# Patient Record
Sex: Female | Born: 1985 | Race: Black or African American | Hispanic: No | Marital: Married | Smoking: Never smoker
Health system: Southern US, Community
[De-identification: ages and names within clinical notes are randomized; demographics above are authoritative.]

## PROBLEM LIST (undated history)

## (undated) DIAGNOSIS — F419 Anxiety disorder, unspecified: Secondary | ICD-10-CM

## (undated) DIAGNOSIS — E663 Overweight: Secondary | ICD-10-CM

## (undated) DIAGNOSIS — R87619 Unspecified abnormal cytological findings in specimens from cervix uteri: Secondary | ICD-10-CM

## (undated) DIAGNOSIS — T7840XA Allergy, unspecified, initial encounter: Secondary | ICD-10-CM

## (undated) DIAGNOSIS — K529 Noninfective gastroenteritis and colitis, unspecified: Secondary | ICD-10-CM

## (undated) DIAGNOSIS — IMO0002 Reserved for concepts with insufficient information to code with codable children: Secondary | ICD-10-CM

## (undated) DIAGNOSIS — Z8719 Personal history of other diseases of the digestive system: Secondary | ICD-10-CM

## (undated) DIAGNOSIS — G43909 Migraine, unspecified, not intractable, without status migrainosus: Secondary | ICD-10-CM

## (undated) DIAGNOSIS — J45909 Unspecified asthma, uncomplicated: Secondary | ICD-10-CM

## (undated) DIAGNOSIS — K08409 Partial loss of teeth, unspecified cause, unspecified class: Secondary | ICD-10-CM

## (undated) HISTORY — PX: ANKLE SURGERY: SHX546

## (undated) HISTORY — DX: Noninfective gastroenteritis and colitis, unspecified: K52.9

## (undated) HISTORY — DX: Personal history of other diseases of the digestive system: Z87.19

## (undated) HISTORY — DX: Migraine, unspecified, not intractable, without status migrainosus: G43.909

## (undated) HISTORY — DX: Allergy, unspecified, initial encounter: T78.40XA

## (undated) HISTORY — DX: Reserved for concepts with insufficient information to code with codable children: IMO0002

## (undated) HISTORY — PX: WISDOM TOOTH EXTRACTION: SHX21

## (undated) HISTORY — DX: Overweight: E66.3

## (undated) HISTORY — DX: Anxiety disorder, unspecified: F41.9

---

## 2009-01-12 ENCOUNTER — Inpatient Hospital Stay (HOSPITAL_COMMUNITY): Admission: AD | Admit: 2009-01-12 | Discharge: 2009-01-12 | Payer: Self-pay | Admitting: Obstetrics & Gynecology

## 2010-06-04 LAB — URINALYSIS, ROUTINE W REFLEX MICROSCOPIC
Glucose, UA: NEGATIVE mg/dL
Ketones, ur: NEGATIVE mg/dL
Protein, ur: 100 mg/dL — AB
Urobilinogen, UA: 0.2 mg/dL (ref 0.0–1.0)

## 2010-06-04 LAB — WET PREP, GENITAL
Trich, Wet Prep: NONE SEEN
Yeast Wet Prep HPF POC: NONE SEEN

## 2010-06-04 LAB — URINE MICROSCOPIC-ADD ON

## 2010-06-04 LAB — URINE CULTURE: Colony Count: >=100000

## 2010-06-04 LAB — POCT PREGNANCY, URINE: Preg Test, Ur: NEGATIVE

## 2010-06-04 LAB — GC/CHLAMYDIA PROBE AMP, GENITAL: Chlamydia, DNA Probe: NEGATIVE

## 2010-08-30 ENCOUNTER — Emergency Department (HOSPITAL_COMMUNITY)
Admission: EM | Admit: 2010-08-30 | Discharge: 2010-08-30 | Disposition: A | Discharge status: Home / Self Care | Payer: BC Managed Care – PPO | Attending: Emergency Medicine | Admitting: Emergency Medicine

## 2010-08-30 ENCOUNTER — Emergency Department (HOSPITAL_COMMUNITY): Payer: BC Managed Care – PPO

## 2010-08-30 DIAGNOSIS — R0602 Shortness of breath: Secondary | ICD-10-CM | POA: Insufficient documentation

## 2010-08-30 DIAGNOSIS — R110 Nausea: Secondary | ICD-10-CM | POA: Insufficient documentation

## 2010-08-30 DIAGNOSIS — R0609 Other forms of dyspnea: Secondary | ICD-10-CM | POA: Insufficient documentation

## 2010-08-30 DIAGNOSIS — R0989 Other specified symptoms and signs involving the circulatory and respiratory systems: Secondary | ICD-10-CM | POA: Insufficient documentation

## 2010-08-30 DIAGNOSIS — R079 Chest pain, unspecified: Secondary | ICD-10-CM | POA: Insufficient documentation

## 2010-08-30 DIAGNOSIS — R109 Unspecified abdominal pain: Secondary | ICD-10-CM | POA: Insufficient documentation

## 2010-08-30 LAB — DIFFERENTIAL
Basophils Absolute: 0 10*3/uL (ref 0.0–0.1)
Basophils Relative: 0 % (ref 0–1)
Eosinophils Absolute: 0.3 10*3/uL (ref 0.0–0.7)
Eosinophils Relative: 3 % (ref 0–5)
Neutrophils Relative %: 63 % (ref 43–77)

## 2010-08-30 LAB — URINALYSIS, ROUTINE W REFLEX MICROSCOPIC
Ketones, ur: NEGATIVE mg/dL
Leukocytes, UA: NEGATIVE
Nitrite: NEGATIVE
Protein, ur: NEGATIVE mg/dL
Urobilinogen, UA: 1 mg/dL (ref 0.0–1.0)

## 2010-08-30 LAB — BASIC METABOLIC PANEL
BUN: 7 mg/dL (ref 6–23)
GFR calc Af Amer: >60 mL/min (ref >60)
GFR calc non Af Amer: >60 mL/min (ref >60)
Potassium: 3.8 mEq/L (ref 3.5–5.1)
Sodium: 132 mEq/L — ABNORMAL LOW (ref 135–145)

## 2010-08-30 LAB — CBC
MCV: 95.8 fL (ref 78.0–100.0)
Platelets: 274 10*3/uL (ref 150–400)
RDW: 12.1 % (ref 11.5–15.5)
WBC: 9.6 10*3/uL (ref 4.0–10.5)

## 2010-08-30 LAB — D-DIMER, QUANTITATIVE: D-Dimer, Quant: <0.22 ug/mL-FEU (ref 0.00–0.48)

## 2010-08-30 LAB — POCT PREGNANCY, URINE: Preg Test, Ur: NEGATIVE

## 2010-09-04 ENCOUNTER — Other Ambulatory Visit: Payer: Self-pay | Admitting: Family Medicine

## 2010-09-04 ENCOUNTER — Ambulatory Visit
Admission: RE | Admit: 2010-09-04 | Discharge: 2010-09-04 | Disposition: A | Discharge status: Home / Self Care | Payer: BC Managed Care – PPO | Source: Ambulatory Visit | Attending: Family Medicine | Admitting: Family Medicine

## 2010-09-04 MED ORDER — IOHEXOL 300 MG/ML  SOLN
125.0000 mL | Freq: Once | INTRAMUSCULAR | Status: AC | PRN
  Administered 2010-09-04: 125 mL via INTRAVENOUS

## 2012-05-03 ENCOUNTER — Encounter (HOSPITAL_COMMUNITY): Payer: Self-pay | Admitting: *Deleted

## 2012-05-03 ENCOUNTER — Inpatient Hospital Stay (HOSPITAL_COMMUNITY)
Admission: AD | Admit: 2012-05-03 | Discharge: 2012-05-03 | Disposition: A | Discharge status: Home / Self Care | Payer: BC Managed Care – PPO | Source: Ambulatory Visit | Attending: Obstetrics and Gynecology | Admitting: Obstetrics and Gynecology

## 2012-05-03 DIAGNOSIS — Z3202 Encounter for pregnancy test, result negative: Secondary | ICD-10-CM | POA: Insufficient documentation

## 2012-05-03 DIAGNOSIS — N949 Unspecified condition associated with female genital organs and menstrual cycle: Secondary | ICD-10-CM | POA: Insufficient documentation

## 2012-05-03 DIAGNOSIS — R1031 Right lower quadrant pain: Secondary | ICD-10-CM | POA: Insufficient documentation

## 2012-05-03 HISTORY — DX: Unspecified abnormal cytological findings in specimens from cervix uteri: R87.619

## 2012-05-03 HISTORY — DX: Reserved for concepts with insufficient information to code with codable children: IMO0002

## 2012-05-03 HISTORY — DX: Unspecified asthma, uncomplicated: J45.909

## 2012-05-03 LAB — URINALYSIS, ROUTINE W REFLEX MICROSCOPIC
Bilirubin Urine: NEGATIVE
Nitrite: NEGATIVE
Specific Gravity, Urine: 1.01 (ref 1.005–1.030)
Urobilinogen, UA: 0.2 mg/dL (ref 0.0–1.0)

## 2012-05-03 NOTE — MAU Provider Note (Signed)
History   27 yo G0 MBF LMP 2/17 presents for evaluation of RLQ "right ovarian pain" which started at 7 pm. Pt denies assoc n/v/f. Had intercourse this am. Trying to conceive. Pain is nonradiating. PMP 1/24. Off OCP 12/2011  Chief Complaint  Patient presents with  . Abdominal Pain     OB History   Grav Para Term Preterm Abortions TAB SAB Ect Mult Living                  Past Medical History  Diagnosis Date  . Asthma   . Abnormal Pap smear     Past Surgical History  Procedure Laterality Date  . Ankle surgery      Right ankle    Family History  Problem Relation Age of Onset  . Cancer Maternal Aunt     Breast  . Cancer Paternal Grandmother     Breast    History  Substance Use Topics  . Smoking status: Never Smoker   . Smokeless tobacco: Not on file  . Alcohol Use: Yes     Comment: Social    Allergies:  Allergies  Allergen Reactions  . Penicillins Hives    Prescriptions prior to admission  Medication Sig Dispense Refill  . albuterol (PROVENTIL HFA;VENTOLIN HFA) 108 (90 BASE) MCG/ACT inhaler Inhale 2 puffs into the lungs as needed for wheezing.      . cetirizine (ZYRTEC ALLERGY) 10 MG tablet Take 10 mg by mouth daily.      . Prenatal Vit-Fe Fumarate-FA (PRENATAL MULTIVITAMIN) TABS Take 1 tablet by mouth daily at 12 noon.         Physical Exam   Blood pressure 131/79, pulse 95, temperature 98.9 F (37.2 C), temperature source Oral, resp. rate 20, height 5' 5.5" (1.664 m), weight 83.462 kg (184 lb), last menstrual period 04/18/2012, SpO2 100.00%.  General appearance: alert, cooperative and no distress Abdomen: soft tender RLQ w/o rebound, nondistended (+) BS  Pelvic: cervix normal in appearance, external genitalia normal, no cervical motion tenderness, uterus normal size, shape, and consistency, vagina normal without discharge and right adnexal tenderness w/o mass palp Extremities: no edema, redness or tenderness in the calves or thighs ED Course  Urine  preg test neg  Ovulatory pain P) f/u dr Juliene Pina. Declined any pain med  COUSINS,SHERONETTE A, MD 11:42 PM 05/03/2012

## 2012-05-03 NOTE — MAU Note (Signed)
Pt reports "i started to have really bad pain where my rt ovary is", states the pain started about 1 1/2 hours.  States the pain is now in her lower back and across her lower abd also.

## 2012-08-17 LAB — OB RESULTS CONSOLE ABO/RH: RH Type: NEGATIVE

## 2012-08-17 LAB — OB RESULTS CONSOLE RPR: RPR: NONREACTIVE

## 2012-08-17 LAB — OB RESULTS CONSOLE ANTIBODY SCREEN: Antibody Screen: NEGATIVE

## 2012-08-17 LAB — OB RESULTS CONSOLE HEPATITIS B SURFACE ANTIGEN: Hepatitis B Surface Ag: NEGATIVE

## 2012-08-17 LAB — OB RESULTS CONSOLE RUBELLA ANTIBODY, IGM: RUBELLA: IMMUNE

## 2012-08-17 LAB — OB RESULTS CONSOLE HIV ANTIBODY (ROUTINE TESTING): HIV: NONREACTIVE

## 2012-08-24 LAB — OB RESULTS CONSOLE GC/CHLAMYDIA
CHLAMYDIA, DNA PROBE: NEGATIVE
GC PROBE AMP, GENITAL: NEGATIVE

## 2013-02-10 LAB — OB RESULTS CONSOLE GBS: GBS: NEGATIVE

## 2013-03-07 ENCOUNTER — Other Ambulatory Visit: Payer: Self-pay | Admitting: Obstetrics & Gynecology

## 2013-03-08 ENCOUNTER — Encounter (HOSPITAL_COMMUNITY): Payer: Self-pay | Admitting: *Deleted

## 2013-03-08 ENCOUNTER — Telehealth (HOSPITAL_COMMUNITY): Payer: Self-pay | Admitting: *Deleted

## 2013-03-08 NOTE — Telephone Encounter (Signed)
Preadmission screen  

## 2013-03-09 ENCOUNTER — Inpatient Hospital Stay (HOSPITAL_COMMUNITY): Payer: BC Managed Care – PPO | Admitting: Anesthesiology

## 2013-03-09 ENCOUNTER — Encounter (HOSPITAL_COMMUNITY): Payer: Self-pay | Admitting: *Deleted

## 2013-03-09 ENCOUNTER — Inpatient Hospital Stay (HOSPITAL_COMMUNITY)
Admission: AD | Admit: 2013-03-09 | Discharge: 2013-03-12 | DRG: 765 | Disposition: A | Discharge status: Home / Self Care | Payer: BC Managed Care – PPO | Source: Ambulatory Visit | Attending: Obstetrics and Gynecology | Admitting: Obstetrics and Gynecology

## 2013-03-09 ENCOUNTER — Encounter (HOSPITAL_COMMUNITY): Payer: BC Managed Care – PPO | Admitting: Anesthesiology

## 2013-03-09 DIAGNOSIS — O9903 Anemia complicating the puerperium: Secondary | ICD-10-CM | POA: Diagnosis not present

## 2013-03-09 DIAGNOSIS — O409XX0 Polyhydramnios, unspecified trimester, not applicable or unspecified: Secondary | ICD-10-CM | POA: Diagnosis present

## 2013-03-09 DIAGNOSIS — O3660X0 Maternal care for excessive fetal growth, unspecified trimester, not applicable or unspecified: Secondary | ICD-10-CM | POA: Diagnosis present

## 2013-03-09 DIAGNOSIS — O4290 Premature rupture of membranes, unspecified as to length of time between rupture and onset of labor, unspecified weeks of gestation: Secondary | ICD-10-CM | POA: Diagnosis present

## 2013-03-09 DIAGNOSIS — O360990 Maternal care for other rhesus isoimmunization, unspecified trimester, not applicable or unspecified: Secondary | ICD-10-CM | POA: Diagnosis present

## 2013-03-09 DIAGNOSIS — D62 Acute posthemorrhagic anemia: Secondary | ICD-10-CM | POA: Diagnosis not present

## 2013-03-09 HISTORY — DX: Partial loss of teeth, unspecified cause, unspecified class: K08.409

## 2013-03-09 LAB — RPR: RPR Ser Ql: NONREACTIVE

## 2013-03-09 LAB — TYPE AND SCREEN
ABO/RH(D): A NEG
Antibody Screen: NEGATIVE

## 2013-03-09 LAB — CBC
HCT: 39 % (ref 36.0–46.0)
Hemoglobin: 13.4 g/dL (ref 12.0–15.0)
MCH: 33.3 pg (ref 26.0–34.0)
MCHC: 34.4 g/dL (ref 30.0–36.0)
MCV: 97 fL (ref 78.0–100.0)
PLATELETS: 170 10*3/uL (ref 150–400)
RBC: 4.02 MIL/uL (ref 3.87–5.11)
RDW: 14.5 % (ref 11.5–15.5)
WBC: 12.2 10*3/uL — ABNORMAL HIGH (ref 4.0–10.5)

## 2013-03-09 LAB — ABO/RH: ABO/RH(D): A NEG

## 2013-03-09 LAB — AMNISURE RUPTURE OF MEMBRANE (ROM) NOT AT ARMC: Amnisure ROM: POSITIVE

## 2013-03-09 MED ORDER — EPHEDRINE 5 MG/ML INJ
10.0000 mg | INTRAVENOUS | Status: DC | PRN
  Filled 2013-03-09: qty 4

## 2013-03-09 MED ORDER — ACETAMINOPHEN 325 MG PO TABS: 650.0000 mg | ORAL_TABLET | ORAL | Status: DC | PRN

## 2013-03-09 MED ORDER — OXYTOCIN 40 UNITS IN LACTATED RINGERS INFUSION - SIMPLE MED
1.0000 m[IU]/min | INTRAVENOUS | Status: DC
  Administered 2013-03-09: 2 m[IU]/min via INTRAVENOUS
  Filled 2013-03-09: qty 1000

## 2013-03-09 MED ORDER — TERBUTALINE SULFATE 1 MG/ML IJ SOLN: 0.2500 mg | Freq: Once | INTRAMUSCULAR | Status: AC | PRN

## 2013-03-09 MED ORDER — PHENYLEPHRINE 40 MCG/ML (10ML) SYRINGE FOR IV PUSH (FOR BLOOD PRESSURE SUPPORT)
80.0000 ug | PREFILLED_SYRINGE | INTRAVENOUS | Status: DC | PRN
  Filled 2013-03-09: qty 10

## 2013-03-09 MED ORDER — CITRIC ACID-SODIUM CITRATE 334-500 MG/5ML PO SOLN
30.0000 mL | ORAL | Status: DC | PRN
  Administered 2013-03-10: 30 mL via ORAL
  Filled 2013-03-09: qty 15

## 2013-03-09 MED ORDER — PHENYLEPHRINE 40 MCG/ML (10ML) SYRINGE FOR IV PUSH (FOR BLOOD PRESSURE SUPPORT): 80.0000 ug | PREFILLED_SYRINGE | INTRAVENOUS | Status: DC | PRN

## 2013-03-09 MED ORDER — LIDOCAINE HCL (PF) 1 % IJ SOLN: 30.0000 mL | INTRAMUSCULAR | Status: DC | PRN

## 2013-03-09 MED ORDER — FENTANYL 2.5 MCG/ML BUPIVACAINE 1/10 % EPIDURAL INFUSION (WH - ANES)
14.0000 mL/h | INTRAMUSCULAR | Status: DC | PRN
  Administered 2013-03-09: 14 mL/h via EPIDURAL
  Filled 2013-03-09: qty 125

## 2013-03-09 MED ORDER — LACTATED RINGERS IV SOLN
500.0000 mL | INTRAVENOUS | Status: DC | PRN
  Administered 2013-03-09: 500 mL via INTRAVENOUS
  Administered 2013-03-09: 200 mL via INTRAVENOUS
  Administered 2013-03-09: 500 mL via INTRAVENOUS
  Administered 2013-03-09 – 2013-03-10 (×2): 300 mL via INTRAVENOUS

## 2013-03-09 MED ORDER — FLEET ENEMA 7-19 GM/118ML RE ENEM: 1.0000 | ENEMA | RECTAL | Status: DC | PRN

## 2013-03-09 MED ORDER — BUTORPHANOL TARTRATE 1 MG/ML IJ SOLN
1.0000 mg | INTRAMUSCULAR | Status: DC | PRN
  Administered 2013-03-09: 1 mg via INTRAVENOUS
  Filled 2013-03-09: qty 1

## 2013-03-09 MED ORDER — LACTATED RINGERS IV SOLN
INTRAVENOUS | Status: DC
  Administered 2013-03-09 – 2013-03-10 (×6): via INTRAVENOUS

## 2013-03-09 MED ORDER — MISOPROSTOL 25 MCG QUARTER TABLET: 25.0000 ug | ORAL_TABLET | ORAL | Status: DC | PRN

## 2013-03-09 MED ORDER — ONDANSETRON HCL 4 MG/2ML IJ SOLN
4.0000 mg | Freq: Four times a day (QID) | INTRAMUSCULAR | Status: DC | PRN
  Administered 2013-03-10: 4 mg via INTRAVENOUS
  Filled 2013-03-09: qty 2

## 2013-03-09 MED ORDER — LACTATED RINGERS IV SOLN
500.0000 mL | Freq: Once | INTRAVENOUS | Status: AC
  Administered 2013-03-09: 500 mL via INTRAVENOUS

## 2013-03-09 MED ORDER — OXYTOCIN BOLUS FROM INFUSION: 500.0000 mL | INTRAVENOUS | Status: DC

## 2013-03-09 MED ORDER — OXYTOCIN 40 UNITS IN LACTATED RINGERS INFUSION - SIMPLE MED: 62.5000 mL/h | INTRAVENOUS | Status: DC

## 2013-03-09 MED ORDER — DIPHENHYDRAMINE HCL 50 MG/ML IJ SOLN: 12.5000 mg | INTRAMUSCULAR | Status: DC | PRN

## 2013-03-09 MED ORDER — IBUPROFEN 600 MG PO TABS: 600.0000 mg | ORAL_TABLET | Freq: Four times a day (QID) | ORAL | Status: DC | PRN

## 2013-03-09 MED ORDER — OXYCODONE-ACETAMINOPHEN 5-325 MG PO TABS: 1.0000 | ORAL_TABLET | ORAL | Status: DC | PRN

## 2013-03-09 MED ORDER — SODIUM BICARBONATE 8.4 % IV SOLN
INTRAVENOUS | Status: DC | PRN
  Administered 2013-03-09 – 2013-03-10 (×3): 5 mL via EPIDURAL
  Administered 2013-03-10: 7 mL via EPIDURAL
  Administered 2013-03-10: 5 mL via EPIDURAL

## 2013-03-09 MED ORDER — EPHEDRINE 5 MG/ML INJ: 10.0000 mg | INTRAVENOUS | Status: DC | PRN
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
## 2013-03-09 NOTE — Anesthesia Preprocedure Evaluation (Signed)
Anesthesia Evaluation  Patient identified by MRN, date of birth, ID band Patient awake    Reviewed: Allergy & Precautions, H&P , Patient's Chart, lab work & pertinent test results  Airway Mallampati: II  TM Distance: >3 FB Neck ROM: full    Dental  (+) Teeth Intact   Pulmonary asthma ,  breath sounds clear to auscultation        Cardiovascular Rhythm:regular Rate:Normal     Neuro/Psych    GI/Hepatic   Endo/Other  Morbid obesity  Renal/GU      Musculoskeletal   Abdominal   Peds  Hematology   Anesthesia Other Findings       Reproductive/Obstetrics (+) Pregnancy                             Anesthesia Physical Anesthesia Plan  ASA: III  Anesthesia Plan: Epidural   Post-op Pain Management:    Induction:   Airway Management Planned:   Additional Equipment:   Intra-op Plan:   Post-operative Plan:   Informed Consent: I have reviewed the patients History and Physical, chart, labs and discussed the procedure including the risks, benefits and alternatives for the proposed anesthesia with the patient or authorized representative who has indicated his/her understanding and acceptance.   Dental Advisory Given  Plan Discussed with:   Anesthesia Plan Comments: (Labs checked- platelets confirmed with RN in room. Fetal heart tracing, per RN, reported to be stable enough for sitting procedure. Discussed epidural, and patient consents to the procedure:  included risk of possible headache,backache, failed block, allergic reaction, and nerve injury. This patient was asked if she had any questions or concerns before the procedure started.)        Anesthesia Quick Evaluation  

## 2013-03-09 NOTE — MAU Note (Signed)
Pt presents to MAU to R/O ROM. 

## 2013-03-09 NOTE — Anesthesia Procedure Notes (Signed)
Epidural Patient location during procedure: OB  Preanesthetic Checklist Completed: patient identified, site marked, surgical consent, pre-op evaluation, timeout performed, IV checked, risks and benefits discussed and monitors and equipment checked  Epidural Patient position: sitting Prep: site prepped and draped and DuraPrep Patient monitoring: continuous pulse ox and blood pressure Approach: midline Injection technique: LOR air  Needle:  Needle type: Tuohy  Needle gauge: 17 G Needle length: 9 cm and 9 Needle insertion depth: 7 cm Catheter type: closed end flexible Catheter size: 19 Gauge Catheter at skin depth: 15 cm Test dose: negative  Assessment Events: blood not aspirated, injection not painful, no injection resistance, negative IV test and no paresthesia  Additional Notes Dosing of Epidural:  1st dose, through catheter ............................................Marland Kitchen. epi 1:200K + Xylocaine 40 mg  2nd dose, through catheter, after waiting 3 minutes...Marland Kitchen.Marland Kitchen.epi 1:200K + Xylocaine 60 mg    ( 2% Xylo charted as a single dose in Epic Meds for ease of charting; actual dosing was fractionated as above, for saftey's sake)  As each dose occurred, patient was free of IV sx; and patient exhibited no evidence of SA injection.  Patient is more comfortable after epidural dosed. Please see RN's note for documentation of vital signs,and FHR which are stable.  Patient reminded not to try to ambulate with numb legs, and that an RN must be present when she attempts to get up.

## 2013-03-09 NOTE — Progress Notes (Signed)
S: Doing well, no complaints, pain worsening, had some relief over the past 1.5 hrs with stadol, now contractions more painful. No further LOF.   O: BP 117/69  Pulse 97  Temp(Src) 98.3 F (36.8 C) (Axillary)  Resp 20  Ht 5\' 5"  (1.651 m)  Wt 104.872 kg (231 lb 3.2 oz)  BMI 38.47 kg/m2  SpO2 100%  LMP 06/06/2012   FHT:  FHR: 150s bpm, variability: moderate,  accelerations:  Present,  decelerations:  Present earlier in the evening a run of variable decels that resolved as heart rate back to baseline of 150's, Over the last 1.5 hrs, 2 prolonged decels,  2min, to the 100's w/ good return. UC:   irregular, every 2-4 minutes SVE:   Dilation: 4 Effacement (%): 100 Station:  (bulging forebag) Exam by:: Ernestina PennaFogleman, MD   A / P:  27 y.o.  Obstetric History   G1   P0   T0   P0   A0   TAB0   SAB0   E0   M0   L0    at 2270w3d IOL for PROM  Fetal Wellbeing:  Category I Pain Control:  Epidural  Anticipated MOD:  unclear- macrosomia  Plan for epidural now, then AROM forebag. Allow continue expectant management at pt now laboring. Risk of shoulder dystocia d/w pt previously.   Aubriana Ravelo A. 03/09/2013, 11:02 PM

## 2013-03-09 NOTE — Progress Notes (Signed)
I came in to the patient's room to find her eating goldfish crackers. I re-educated patient to the clear liquid diet and the risks of eating solid food during labor in the event of a c-section. Patient verbalized understanding and stopped eating.

## 2013-03-09 NOTE — Progress Notes (Signed)
I called Dr. Ernestina PennaFogleman to notify her of FHR increasing baseline, I also told her about the frequent variables and brief sequence of lates. Dr. Ernestina PennaFogleman reviewed the strip. We both agree that patient is likely about to spike a fever. Dr. Ernestina PennaFogleman asked that patient's tempurature be taken every 30 min over the next hour to monitor for fever more closely. I reported this off the the night shift RN.

## 2013-03-09 NOTE — Progress Notes (Signed)
S: Doing well, no complaints, pain well controlled without epidural, feeling some contractions  O: BP 147/79  Pulse 115  Temp(Src) 98.5 F (36.9 C) (Oral)  Resp 14  Ht 5\' 5"  (1.651 m)  Wt 104.872 kg (231 lb 3.2 oz)  BMI 38.47 kg/m2  LMP 06/06/2012   FHT:  FHR: 150s bpm, variability: moderate,  accelerations:  Present,  decelerations:  Absent UC:   irregular, every 2-3 minutes, pit at 8 munits/ min SVE:   Dilation: 1 Effacement (%): 20 Station: -3 Exam by:: Dr. Ernestina PennaFogleman   A / P:  27 y.o.  Obstetric History   G1   P0   T0   P0   A0   TAB0   SAB0   E0   M0   L0    at 3982w3d IOL for PROM, latent  Fetal Wellbeing:  Category I Pain Control:  Labor support without medications  Anticipated MOD:  unclear, macrosomia, pt aware risks of shouder dystocia  Tracy Blair A. 03/09/2013, 5:30 PM
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
## 2013-03-09 NOTE — MAU Note (Signed)
Pt reports she started  Having leaking around 9:30 that has not stopped . Clear fluid reported.. Pt denies ctx but reports increased pelvic pressure. Good fetal movement felt.

## 2013-03-10 ENCOUNTER — Encounter (HOSPITAL_COMMUNITY)
Admission: AD | Disposition: A | Discharge status: Home / Self Care | Payer: Self-pay | Source: Ambulatory Visit | Attending: Obstetrics and Gynecology

## 2013-03-10 ENCOUNTER — Encounter (HOSPITAL_COMMUNITY): Payer: Self-pay | Admitting: Anesthesiology

## 2013-03-10 ENCOUNTER — Inpatient Hospital Stay (HOSPITAL_COMMUNITY): Admission: RE | Admit: 2013-03-10 | Payer: BC Managed Care – PPO | Source: Ambulatory Visit

## 2013-03-10 SURGERY — Surgical Case
Anesthesia: Epidural | Site: Abdomen

## 2013-03-10 MED ORDER — MORPHINE SULFATE (PF) 0.5 MG/ML IJ SOLN
INTRAMUSCULAR | Status: DC | PRN
  Administered 2013-03-10: 3 mg via EPIDURAL

## 2013-03-10 MED ORDER — DIPHENHYDRAMINE HCL 50 MG/ML IJ SOLN: 25.0000 mg | INTRAMUSCULAR | Status: DC | PRN

## 2013-03-10 MED ORDER — LIDOCAINE-EPINEPHRINE (PF) 2 %-1:200000 IJ SOLN
INTRAMUSCULAR | Status: AC
  Filled 2013-03-10: qty 20

## 2013-03-10 MED ORDER — PNEUMOCOCCAL VAC POLYVALENT 25 MCG/0.5ML IJ INJ
0.5000 mL | INJECTION | INTRAMUSCULAR | Status: AC
  Administered 2013-03-10: 0.5 mL via INTRAMUSCULAR
  Filled 2013-03-10: qty 0.5

## 2013-03-10 MED ORDER — METOCLOPRAMIDE HCL 5 MG/ML IJ SOLN: 10.0000 mg | Freq: Three times a day (TID) | INTRAMUSCULAR | Status: DC | PRN

## 2013-03-10 MED ORDER — ZOLPIDEM TARTRATE 5 MG PO TABS: 5.0000 mg | ORAL_TABLET | Freq: Every evening | ORAL | Status: DC | PRN

## 2013-03-10 MED ORDER — SODIUM BICARBONATE 8.4 % IV SOLN
INTRAVENOUS | Status: AC
  Filled 2013-03-10: qty 50

## 2013-03-10 MED ORDER — CEFAZOLIN SODIUM-DEXTROSE 2-3 GM-% IV SOLR
INTRAVENOUS | Status: DC | PRN
  Administered 2013-03-10: 2 g via INTRAVENOUS

## 2013-03-10 MED ORDER — TETANUS-DIPHTH-ACELL PERTUSSIS 5-2.5-18.5 LF-MCG/0.5 IM SUSP: 0.5000 mL | Freq: Once | INTRAMUSCULAR | Status: DC

## 2013-03-10 MED ORDER — PHENYLEPHRINE 40 MCG/ML (10ML) SYRINGE FOR IV PUSH (FOR BLOOD PRESSURE SUPPORT)
PREFILLED_SYRINGE | INTRAVENOUS | Status: AC
  Filled 2013-03-10: qty 5

## 2013-03-10 MED ORDER — SCOPOLAMINE 1 MG/3DAYS TD PT72
1.0000 | MEDICATED_PATCH | Freq: Once | TRANSDERMAL | Status: DC
  Administered 2013-03-10: 1.5 mg via TRANSDERMAL

## 2013-03-10 MED ORDER — OXYCODONE-ACETAMINOPHEN 5-325 MG PO TABS
1.0000 | ORAL_TABLET | ORAL | Status: DC | PRN
  Administered 2013-03-11 – 2013-03-12 (×2): 1 via ORAL
  Filled 2013-03-10 (×2): qty 1

## 2013-03-10 MED ORDER — KETOROLAC TROMETHAMINE 30 MG/ML IJ SOLN
30.0000 mg | Freq: Four times a day (QID) | INTRAMUSCULAR | Status: DC | PRN
Start: 2013-03-10 — End: 2013-03-10

## 2013-03-10 MED ORDER — CEFAZOLIN SODIUM-DEXTROSE 2-3 GM-% IV SOLR
INTRAVENOUS | Status: AC
  Filled 2013-03-10: qty 50

## 2013-03-10 MED ORDER — ONDANSETRON HCL 4 MG/2ML IJ SOLN
INTRAMUSCULAR | Status: AC
  Filled 2013-03-10: qty 2

## 2013-03-10 MED ORDER — KETOROLAC TROMETHAMINE 30 MG/ML IJ SOLN
INTRAMUSCULAR | Status: AC
  Filled 2013-03-10: qty 1

## 2013-03-10 MED ORDER — PRENATAL MULTIVITAMIN CH
1.0000 | ORAL_TABLET | Freq: Every day | ORAL | Status: DC
  Administered 2013-03-10 – 2013-03-12 (×3): 1 via ORAL
  Filled 2013-03-10 (×3): qty 1

## 2013-03-10 MED ORDER — OXYTOCIN 10 UNIT/ML IJ SOLN
INTRAMUSCULAR | Status: AC
  Filled 2013-03-10: qty 4

## 2013-03-10 MED ORDER — DIPHENHYDRAMINE HCL 50 MG/ML IJ SOLN
12.5000 mg | INTRAMUSCULAR | Status: DC | PRN
Start: 2013-03-10 — End: 2013-03-11

## 2013-03-10 MED ORDER — ONDANSETRON HCL 4 MG/2ML IJ SOLN
INTRAMUSCULAR | Status: DC | PRN
  Administered 2013-03-10: 4 mg via INTRAVENOUS

## 2013-03-10 MED ORDER — SIMETHICONE 80 MG PO CHEW
80.0000 mg | CHEWABLE_TABLET | ORAL | Status: DC
  Administered 2013-03-10 – 2013-03-12 (×2): 80 mg via ORAL
  Filled 2013-03-10 (×2): qty 1

## 2013-03-10 MED ORDER — WITCH HAZEL-GLYCERIN EX PADS: 1.0000 "application " | MEDICATED_PAD | CUTANEOUS | Status: DC | PRN

## 2013-03-10 MED ORDER — OXYTOCIN 10 UNIT/ML IJ SOLN
40.0000 [IU] | INTRAVENOUS | Status: DC | PRN
  Administered 2013-03-10: 40 [IU] via INTRAVENOUS

## 2013-03-10 MED ORDER — SIMETHICONE 80 MG PO CHEW: 80.0000 mg | CHEWABLE_TABLET | ORAL | Status: DC | PRN

## 2013-03-10 MED ORDER — DIPHENHYDRAMINE HCL 25 MG PO CAPS
25.0000 mg | ORAL_CAPSULE | ORAL | Status: DC | PRN
  Administered 2013-03-10: 25 mg via ORAL
  Filled 2013-03-10: qty 1

## 2013-03-10 MED ORDER — HYDROMORPHONE HCL PF 1 MG/ML IJ SOLN
INTRAMUSCULAR | Status: AC
  Administered 2013-03-10: 0.5 mg via INTRAVENOUS
  Filled 2013-03-10: qty 1

## 2013-03-10 MED ORDER — OXYTOCIN 40 UNITS IN LACTATED RINGERS INFUSION - SIMPLE MED: 62.5000 mL/h | INTRAVENOUS | Status: AC

## 2013-03-10 MED ORDER — LACTATED RINGERS IV SOLN
INTRAVENOUS | Status: DC
  Administered 2013-03-11: 04:00:00 via INTRAVENOUS

## 2013-03-10 MED ORDER — MEPERIDINE HCL 25 MG/ML IJ SOLN: 6.2500 mg | INTRAMUSCULAR | Status: DC | PRN

## 2013-03-10 MED ORDER — ONDANSETRON HCL 4 MG/2ML IJ SOLN: 4.0000 mg | INTRAMUSCULAR | Status: DC | PRN

## 2013-03-10 MED ORDER — SIMETHICONE 80 MG PO CHEW
80.0000 mg | CHEWABLE_TABLET | Freq: Three times a day (TID) | ORAL | Status: DC
  Administered 2013-03-10 – 2013-03-12 (×6): 80 mg via ORAL
  Filled 2013-03-10 (×7): qty 1

## 2013-03-10 MED ORDER — SCOPOLAMINE 1 MG/3DAYS TD PT72
MEDICATED_PATCH | TRANSDERMAL | Status: AC
  Administered 2013-03-10: 06:00:00 1.5 mg via TRANSDERMAL
  Filled 2013-03-10: qty 1

## 2013-03-10 MED ORDER — PHENYLEPHRINE HCL 10 MG/ML IJ SOLN
INTRAMUSCULAR | Status: DC | PRN
  Administered 2013-03-10 (×5): 40 ug via INTRAVENOUS

## 2013-03-10 MED ORDER — 0.9 % SODIUM CHLORIDE (POUR BTL) OPTIME
TOPICAL | Status: DC | PRN
  Administered 2013-03-10: 1000 mL

## 2013-03-10 MED ORDER — IBUPROFEN 600 MG PO TABS
600.0000 mg | ORAL_TABLET | Freq: Four times a day (QID) | ORAL | Status: DC
  Administered 2013-03-10 – 2013-03-12 (×9): 600 mg via ORAL
  Filled 2013-03-10 (×9): qty 1

## 2013-03-10 MED ORDER — LANOLIN HYDROUS EX OINT: 1.0000 "application " | TOPICAL_OINTMENT | CUTANEOUS | Status: DC | PRN

## 2013-03-10 MED ORDER — NALOXONE HCL 1 MG/ML IJ SOLN
1.0000 ug/kg/h | INTRAVENOUS | Status: DC | PRN
  Filled 2013-03-10: qty 2

## 2013-03-10 MED ORDER — NALBUPHINE SYRINGE 5 MG/0.5 ML
5.0000 mg | INJECTION | INTRAMUSCULAR | Status: DC | PRN
  Filled 2013-03-10: qty 1

## 2013-03-10 MED ORDER — HYDROMORPHONE HCL PF 1 MG/ML IJ SOLN
INTRAMUSCULAR | Status: AC
  Filled 2013-03-10: qty 1

## 2013-03-10 MED ORDER — MENTHOL 3 MG MT LOZG: 1.0000 | LOZENGE | OROMUCOSAL | Status: DC | PRN

## 2013-03-10 MED ORDER — SODIUM CHLORIDE 0.9 % IJ SOLN: 3.0000 mL | INTRAMUSCULAR | Status: DC | PRN

## 2013-03-10 MED ORDER — MORPHINE SULFATE 0.5 MG/ML IJ SOLN
INTRAMUSCULAR | Status: AC
  Filled 2013-03-10: qty 10

## 2013-03-10 MED ORDER — DIBUCAINE 1 % RE OINT: 1.0000 "application " | TOPICAL_OINTMENT | RECTAL | Status: DC | PRN

## 2013-03-10 MED ORDER — SENNOSIDES-DOCUSATE SODIUM 8.6-50 MG PO TABS
2.0000 | ORAL_TABLET | ORAL | Status: DC
  Administered 2013-03-10 – 2013-03-12 (×2): 2 via ORAL
  Filled 2013-03-10 (×2): qty 2

## 2013-03-10 MED ORDER — NALOXONE HCL 0.4 MG/ML IJ SOLN: 0.4000 mg | INTRAMUSCULAR | Status: DC | PRN

## 2013-03-10 MED ORDER — KETOROLAC TROMETHAMINE 30 MG/ML IJ SOLN
30.0000 mg | Freq: Four times a day (QID) | INTRAMUSCULAR | Status: DC | PRN
  Administered 2013-03-10: 30 mg via INTRAMUSCULAR

## 2013-03-10 MED ORDER — LACTATED RINGERS IV SOLN
INTRAVENOUS | Status: DC | PRN
  Administered 2013-03-10 (×2): via INTRAVENOUS

## 2013-03-10 MED ORDER — DIPHENHYDRAMINE HCL 25 MG PO CAPS
25.0000 mg | ORAL_CAPSULE | Freq: Four times a day (QID) | ORAL | Status: DC | PRN
Start: 2013-03-10 — End: 2013-03-12
  Administered 2013-03-11: 25 mg via ORAL
  Filled 2013-03-10: qty 1

## 2013-03-10 MED ORDER — ONDANSETRON HCL 4 MG/2ML IJ SOLN: 4.0000 mg | Freq: Three times a day (TID) | INTRAMUSCULAR | Status: DC | PRN

## 2013-03-10 MED ORDER — NALBUPHINE SYRINGE 5 MG/0.5 ML
5.0000 mg | INJECTION | INTRAMUSCULAR | Status: DC | PRN
  Administered 2013-03-10 (×2): 10 mg via SUBCUTANEOUS
  Filled 2013-03-10 (×3): qty 1

## 2013-03-10 MED ORDER — ONDANSETRON HCL 4 MG PO TABS: 4.0000 mg | ORAL_TABLET | ORAL | Status: DC | PRN

## 2013-03-10 MED ORDER — HYDROMORPHONE HCL PF 1 MG/ML IJ SOLN
0.2500 mg | INTRAMUSCULAR | Status: DC | PRN
Start: 2013-03-10 — End: 2013-03-10
  Administered 2013-03-10 (×3): 0.5 mg via INTRAVENOUS

## 2013-03-10 SURGICAL SUPPLY — 36 items
CLAMP CORD UMBIL (MISCELLANEOUS) ×3 IMPLANT
CLOSURE WOUND 1/2 X4 (GAUZE/BANDAGES/DRESSINGS)
CLOTH BEACON ORANGE TIMEOUT ST (SAFETY) ×3 IMPLANT
CONTAINER PREFILL 10% NBF 15ML (MISCELLANEOUS) IMPLANT
DRAPE LG THREE QUARTER DISP (DRAPES) ×3 IMPLANT
DRSG OPSITE POSTOP 4X10 (GAUZE/BANDAGES/DRESSINGS) ×3 IMPLANT
DURAPREP 26ML APPLICATOR (WOUND CARE) ×3 IMPLANT
ELECT REM PT RETURN 9FT ADLT (ELECTROSURGICAL) ×3
ELECTRODE REM PT RTRN 9FT ADLT (ELECTROSURGICAL) ×1 IMPLANT
EXTRACTOR VACUUM KIWI (MISCELLANEOUS) IMPLANT
EXTRACTOR VACUUM M CUP 4 TUBE (SUCTIONS) IMPLANT
EXTRACTOR VACUUM M CUP 4' TUBE (SUCTIONS)
GLOVE BIO SURGEON STRL SZ 6.5 (GLOVE) ×2 IMPLANT
GLOVE BIO SURGEONS STRL SZ 6.5 (GLOVE) ×1
GLOVE BIOGEL PI IND STRL 7.0 (GLOVE) ×1 IMPLANT
GLOVE BIOGEL PI INDICATOR 7.0 (GLOVE) ×2
GOWN PREVENTION PLUS XLARGE (GOWN DISPOSABLE) ×3 IMPLANT
GOWN STRL REIN XL XLG (GOWN DISPOSABLE) ×6 IMPLANT
KIT ABG SYR 3ML LUER SLIP (SYRINGE) IMPLANT
NEEDLE HYPO 25X5/8 SAFETYGLIDE (NEEDLE) IMPLANT
NS IRRIG 1000ML POUR BTL (IV SOLUTION) ×3 IMPLANT
PACK C SECTION WH (CUSTOM PROCEDURE TRAY) ×3 IMPLANT
PAD OB MATERNITY 4.3X12.25 (PERSONAL CARE ITEMS) ×3 IMPLANT
STAPLER VISISTAT 35W (STAPLE) IMPLANT
STRIP CLOSURE SKIN 1/2X4 (GAUZE/BANDAGES/DRESSINGS) IMPLANT
SUT MON AB 4-0 PS1 27 (SUTURE) ×3 IMPLANT
SUT PLAIN 0 NONE (SUTURE) IMPLANT
SUT PLAIN 2 0 XLH (SUTURE) ×3 IMPLANT
SUT VIC AB 0 CT1 36 (SUTURE) ×6 IMPLANT
SUT VIC AB 0 CTX 36 (SUTURE) ×4
SUT VIC AB 0 CTX36XBRD ANBCTRL (SUTURE) ×2 IMPLANT
SUT VIC AB 2-0 CT1 27 (SUTURE) ×2
SUT VIC AB 2-0 CT1 TAPERPNT 27 (SUTURE) ×1 IMPLANT
TOWEL OR 17X24 6PK STRL BLUE (TOWEL DISPOSABLE) ×3 IMPLANT
TRAY FOLEY CATH 14FR (SET/KITS/TRAYS/PACK) IMPLANT
WATER STERILE IRR 1000ML POUR (IV SOLUTION) IMPLANT

## 2013-03-10 NOTE — Transfer of Care (Signed)
Immediate Anesthesia Transfer of Care Note  Patient: Tracy CoasterMichelle Harris Blair  Procedure(s) Performed: Procedure(s): Primary Cesarean Section Delivery Baby Girl @  [phone], Apgars 8/9 (N/A)  Patient Location: PACU  Anesthesia Type:Epidural  Level of Consciousness: awake, alert , oriented and patient cooperative  Airway & Oxygen Therapy: Patient Spontanous Breathing  Post-op Assessment: Report given to PACU RN and Post -op Vital signs reviewed and stable  Post vital signs: Reviewed and stable  Complications: No apparent anesthesia complications

## 2013-03-10 NOTE — Brief Op Note (Signed)
03/09/2013 - 03/10/2013  5:30 AM  PATIENT:  Tracy CoasterMichelle Harris Blair  28 y.o. female  PRE-OPERATIVE DIAGNOSIS:  Arrested Dilatation  POST-OPERATIVE DIAGNOSIS:  Arrested Dilatation  PROCEDURE:  Procedure(s): Primary Cesarean Section Delivery Baby Girl @  [phone], Apgars 8/9 (N/A)  SURGEON:  Surgeon(s) and Role:    * Jahrel Borthwick A. Ernestina PennaFogleman, MD - Primary  PHYSICIAN ASSISTANT:   ASSISTANTSDenyse Amass: Bhambri, CNM   ANESTHESIA:   epidural  EBL:  Total I/O In: 3100 [I.V.:3100] Out: 2300 [Urine:1400; Blood:900]  BLOOD ADMINISTERED:none  DRAINS: Urinary Catheter (Foley)   LOCAL MEDICATIONS USED:  NONE  SPECIMEN:  Source of Specimen:  placenta  DISPOSITION OF SPECIMEN:  L&D  COUNTS:  YES  TOURNIQUET:  * No tourniquets in log *  DICTATION: .Note written in EPIC  PLAN OF CARE: Admit to inpatient   PATIENT DISPOSITION:  PACU - hemodynamically stable.   Delay start of Pharmacological VTE agent (>24hrs) due to surgical blood loss or risk of bleeding: yes

## 2013-03-10 NOTE — Lactation Note (Signed)
This note was copied from the chart of Girl Lynne LoganMichelle Jefferson. Lactation Consultation Note: Initial visit with mom. She reports that baby has latched on well but does tuck her bottom lip under. Mom knows to wait for wide open mouth. Reviewed feeding cues and encouraged to feed whenever she sees them. Mom holding baby- baby asleep at present. BF brochure given with resources for support after DC.No questions at present. To call for assist prn   Patient Name: Girl Lynne LoganMichelle Jefferson ONGEX'BToday's Date: 03/10/2013 Reason for consult: Initial assessment   Maternal Data Formula Feeding for Exclusion: No Infant to breast within first hour of birth: Yes Does the patient have breastfeeding experience prior to this delivery?: No  Feeding Feeding Type: Breast Fed  LATCH Score/Interventions                      Lactation Tools Discussed/Used     Consult Status Consult Status: Follow-up Date: 03/11/13 Follow-up type: In-patient    Pamelia HoitWeeks, Oliviah Agostini D 03/10/2013, 2:56 PM

## 2013-03-10 NOTE — Anesthesia Postprocedure Evaluation (Signed)
  Anesthesia Post-op Note  Patient: Tracy Blair  Procedure(s) Performed: Procedure(s): Primary Cesarean Section Delivery Baby Girl @  [phone], Apgars 8/9 (N/A)  Patient Location: Mother/Baby  Anesthesia Type:Spinal  Level of Consciousness: awake, alert  and oriented  Airway and Oxygen Therapy: Patient Spontanous Breathing  Post-op Pain: none  Post-op Assessment: Post-op Vital signs reviewed and Patient's Cardiovascular Status Stable  Post-op Vital Signs: Reviewed and stable  Complications: No apparent anesthesia complications

## 2013-03-10 NOTE — Op Note (Signed)
03/09/2013 - 03/10/2013  5:30 AM  PATIENT:  Tracy CoasterMichelle Harris Blair  28 y.o. female  PRE-OPERATIVE DIAGNOSIS:  Arrested Dilatation  POST-OPERATIVE DIAGNOSIS:  Arrested Dilatation  PROCEDURE:  Procedure(s): Primary Cesarean Section Delivery Baby Girl @  [phone], Apgars 8/9 (N/A)  SURGEON:  Surgeon(s) and Role:    * Chaise Passarella A. Ernestina PennaFogleman, MD - Primary  PHYSICIAN ASSISTANT:   ASSISTANTSDenyse Amass: Bhambri, CNM   ANESTHESIA:   epidural  EBL:  Total I/O In: 3100 [I.V.:3100] Out: 2300 [Urine:1400; Blood:900]  BLOOD ADMINISTERED:none  DRAINS: Urinary Catheter (Foley)   LOCAL MEDICATIONS USED:  NONE  SPECIMEN:  Source of Specimen:  placenta  DISPOSITION OF SPECIMEN:  L&D  COUNTS:  YES  TOURNIQUET:  * No tourniquets in log *  DICTATION: .Note written in EPIC  PLAN OF CARE: Admit to inpatient   PATIENT DISPOSITION:  PACU - hemodynamically stable.   Delay start of Pharmacological VTE agent (>24hrs) due to surgical blood loss or risk of bleeding: yes   Findings:  @BABYSEXEBC @ infant,  APGAR (1 MIN):  8 APGAR (5 MINS):  9 APGAR (10 MINS):    Nl uterus, tubes, ovaries, nl placenta, 3VC, clear amniotic fluid,  EBL: 900 cc Antibiotics:  2g Ancef  Complications: none  Indications: This is a 28 y.o. year-old, G1  At 6346w4d admitted for ROM. Pt started on pitocin after SROM. After several hours of pitocin, pt achieved active labor, progressing to 4/80%./ -3. Epidural placed and AROM of forebag. Pt remained at 4 cm >3 hrs, in active labor w/ no fetal descent. Given concerns of macrosomia and no descent, decision made to proceed w/ c/s. Risks benefits and alternatives of the procedure were discussed with the patient who agreed to proceed  Procedure:  After informed consent was obtained the patient was taken to the operating room where epidural anesthesia was initiated.  She was prepped and draped in the normal sterile fashion in dorsal supine position with a leftward tilt.  A foley catheter was in  place.  A Pfannenstiel skin incision was made 2 cm above the pubic symphysis in the midline with the scalpel.  Dissection was carried down with the Bovie cautery until the fascia was reached. The fascia was incised in the midline. The incision was extended laterally with the Mayo scissors. The inferior aspect of the fascial incision was grasped with the Coker clamps, elevated up and the underlying rectus muscles were dissected off sharply. The superior aspect of the fascial incision was grasped with the Coker clamps elevated up and the underlying rectus muscles were dissected off sharply.  The peritoneum was entered sharply. The peritoneal incision was extended superiorly and inferiorly with good visualization of the bladder. The bladder blade was inserted and palpation was done to assess the fetal position and the location of the uterine vessels. The lower segment of the uterus was incised sharply with the scalpel and extended bluntly in the cephalocaudad fashion. The infant was grasped, brought to the incision,  rotated and the infant was delivered with fundal pressure. The nose and mouth were bulb suctioned. The cord was clamped and cut. The infant was handed off to the waiting pediatrician. The placenta was expressed. The uterus was left in situ. The uterus was cleared of all clots and debris. The uterine incision was repaired with 0 Vicryl in a running locked fashion.  A second layer of the same suture was used in an imbricating fashion to obtain excellent hemostasis. The gutters were cleared of all clots and debris  and the tubes and ovaries were visualized. The uterine incision was reinspected and found to be hemostatic. The peritoneum was grasped and closed with 2-0 Vicryl in a running fashion. The cut muscle edges and the underside of the fascia were inspected and found to be hemostatic. The fascia was closed with 0 Vicryl in two halves . The subcutaneous tissue was irrigated. Scarpa's layer was closed with  a 2-0 plain gut suture. The skin was closed with a 4-0 Monocryl in a single layer. The patient tolerated the procedure well. Sponge lap and needle counts were correct x3 and patient was taken to the recovery room in a stable condition.  Remi Rester A. 03/10/2013 5:31 AM

## 2013-03-10 NOTE — Anesthesia Postprocedure Evaluation (Signed)
  Anesthesia Post-op Note  Patient: Tracy Blair  Procedure(s) Performed: Procedure(s): Primary Cesarean Section Delivery Baby Girl @  [phone], Apgars 8/9 (N/A)  Patient is awake, responsive, moving her legs, and has signs of resolution of her numbness. Pain and nausea are reasonably well controlled. Vital signs are stable and clinically acceptable. Oxygen saturation is clinically acceptable. There are no apparent anesthetic complications at this time. Patient is ready for discharge.

## 2013-03-11 LAB — CBC
HEMATOCRIT: 30.4 % — AB (ref 36.0–46.0)
HEMOGLOBIN: 10.1 g/dL — AB (ref 12.0–15.0)
MCH: 32.8 pg (ref 26.0–34.0)
MCHC: 33.2 g/dL (ref 30.0–36.0)
MCV: 98.7 fL (ref 78.0–100.0)
Platelets: 127 10*3/uL — ABNORMAL LOW (ref 150–400)
RBC: 3.08 MIL/uL — AB (ref 3.87–5.11)
RDW: 14.9 % (ref 11.5–15.5)
WBC: 11.7 10*3/uL — AB (ref 4.0–10.5)

## 2013-03-11 LAB — CCBB MATERNAL DONOR DRAW

## 2013-03-11 MED ORDER — PNEUMOCOCCAL VAC POLYVALENT 25 MCG/0.5ML IJ INJ
0.5000 mL | INJECTION | INTRAMUSCULAR | Status: DC
Start: 2013-03-12 — End: 2013-03-11

## 2013-03-11 NOTE — Lactation Note (Signed)
This note was copied from the chart of Tracy Lynne LoganMichelle Blair. Lactation Consultation Note  Patient Name: Tracy Lynne LoganMichelle Blair ZOXWR'UToday's Date: 03/11/2013 Reason for consult: Follow-up assessment;Difficult latch Mom reports baby cluster fed last night and has been sleepy all day. She has attempted to BF but baby would not latch. Baby in Mom's arms when I arrived, sucking vigorously on pacifier. Baby has very tight mouth, Mom reports she will not keep her bottom lip down. Attempted to latch baby in football hold. Baby could obtain latch but was not sustaining a latch. Continued to come on and off the breast for 10 minutes. Initiated #20 nipple shield for baby to sustain latch. Questionable frenulum noted by LC with finger sweep, she does not bring her tongue past the bottom lip and cups her tongue with crying. Mom reports she cluster fed last night and did come on and off the breast. Baby nursed a good 15 more minutes with the nipple shield, small amount of blood noted at the end of the feeding, scant amount of colostrum present. Mom was able to latch baby to left breast without the nipple shield. She again came on and off having trouble sustaining the latch, but did do better on the left breast than the right. Advised Mom not to use pacifier. Gave Mom shells and she put on at the end of this feeding. Demonstrated how to pre-pump to help with latch. Advised to wear shells, pre-pump, attempt to latch without the nipple shield, if baby cannot sustain the latch, then apply #20 nipple shield, look for colostrum in the nipple shield at the end of the feeding. Post pump as often as she can with hand pump to encourage milk production. Ask for assist as needed with feedings. Advised parents baby should be at the breast 8-12 times or more in 24 hours now that she is over 24 hour old, advised to expect more cluster feeding tonight.   Maternal Data    Feeding Feeding Type: Breast Fed Length of feed: 30 min  LATCH  Score/Interventions Latch: Repeated attempts needed to sustain latch, nipple held in mouth throughout feeding, stimulation needed to elicit sucking reflex. Intervention(s): Adjust position;Assist with latch;Breast massage;Breast compression  Audible Swallowing: A few with stimulation  Type of Nipple: Everted at rest and after stimulation (short nipple shaft)  Comfort (Breast/Nipple): Soft / non-tender     Hold (Positioning): Assistance needed to correctly position infant at breast and maintain latch. Intervention(s): Breastfeeding basics reviewed;Support Pillows;Position options;Skin to skin  LATCH Score: 7  Lactation Tools Discussed/Used Tools: Shells;Pump Nipple shield size: 20;24 Shell Type: Inverted Breast pump type: Manual   Consult Status Consult Status: Follow-up Date: 03/12/13 Follow-up type: In-patient    Alfred LevinsGranger, Spiro Ausborn Ann 03/11/2013, 9:07 PM

## 2013-03-11 NOTE — Progress Notes (Signed)
POSTOPERATIVE DAY # 1 S/P cesarean section - arrest of active labor   S:         Reports feeling tired             Tolerating po intake / no nausea / no vomiting / no flatus / no BM             Bleeding is light             Pain controlled with motrin and percocet             Up ad lib / ambulatory/ voiding QS  Newborn breast feeding    O:  VS: BP 126/77  Pulse 95  Temp(Src) 98.1 F (36.7 C) (Oral)  Resp 18  Ht 5\' 5"  (1.651 m)  Wt 104.872 kg (231 lb 3.2 oz)  BMI 38.47 kg/m2  SpO2 100%  LMP 06/06/2012   LABS:               Recent Labs  03/09/13 1257 03/11/13 0627  WBC 12.2* 11.7*  HGB 13.4 10.1*  PLT 170 127*               Bloodtype: --/--/A NEG, A NEG (01/08 1302)  Rubella: Immune (06/18 0000)                                             I&O: Intake/Output     01/09 0701 - 01/10 0700 01/10 0701 - 01/11 0700   I.V. (mL/kg)     Total Intake(mL/kg)     Urine (mL/kg/hr) 3775 (1.5)    Blood     Total Output 3775     Net -3775                       Physical Exam:             Alert and Oriented X3  Lungs: Clear and unlabored  Heart: regular rate and rhythm / no mumurs  Abdomen: soft, non-tender, non-distended active BS             Fundus: firm, non-tender, Ueven             Dressing intact honeycomb              Incision:  approximated with stures / no erythema / no ecchymosis / no drainage  Perineum: intact  Lochia: light  Extremities: 2+ edema, no calf pain or tenderness, negative Homans  A:        POD # 1 S/P CS            Mild ABL anemia  P:        Routine postoperative care              Advance activity - anticipate early DC tomorrow   Marlinda MikeBAILEY, Adric Wrede CNM, MSN, [facility] 03/11/2013, 10:09 AM

## 2013-03-12 MED ORDER — IBUPROFEN 600 MG PO TABS: 600.0000 mg | ORAL_TABLET | Freq: Four times a day (QID) | ORAL | Status: DC

## 2013-03-12 MED ORDER — OXYCODONE-ACETAMINOPHEN 5-325 MG PO TABS: 1.0000 | ORAL_TABLET | ORAL | Status: DC | PRN

## 2013-03-12 NOTE — Progress Notes (Signed)
POSTOPERATIVE DAY # 2 S/P CS   S:         Reports feeling well - wants to go home             Tolerating po intake / no nausea / no vomiting / + flatus / no BM             Bleeding is spotting             Pain controlled with motrin and percocet             Up ad lib / ambulatory/ voiding QS  Newborn breast feeding   O:  VS: BP 110/73  Pulse 88  Temp(Src) 98 F (36.7 C) (Oral)  Resp 18  Ht 5\' 5"  (1.651 m)  Wt 104.872 kg (231 lb 3.2 oz)  BMI 38.47 kg/m2  SpO2 99%  LMP 06/06/2012   LABS:               Recent Labs  03/11/13 0627  WBC 11.7*  HGB 10.1*  PLT 127*               Bloodtype: --/--/A NEG, A NEG (01/08 1302)  Rubella: Immune (06/18 0000)                                               Physical Exam:             Alert and Oriented X3  Abdomen: soft, non-tender, non-distended BS active             Fundus: firm, non-tender, Ueven             Dressing intact honeycomb              Incision:  approximated with sutures / no erythema / no ecchymosis / no drainage  Perineum: intact  Lochia: light  Extremities: trace edema, no calf pain or tenderness, negative Homans  A:        POD # 2 S/P CS             P:        Routine postoperative care              DC home     Marlinda MikeBAILEY, Kacper Cartlidge CNM, MSN, [facility] 03/12/2013, 1:30 PM

## 2013-03-12 NOTE — Lactation Note (Signed)
This note was copied from the chart of Tracy Blair. Lactation Consultation Note  Patient Name: Tracy Blair UJWJX'BToday's Date: 03/12/2013 Reason for consult: Follow-up assessment;Difficult latch Mom reports she and baby are still struggling with latch, she has some bleeding from right nipple when using nipple shield. Mom has decided to pump and bottle feed as this is less frustrating for her and baby.  She has DEBP for home use. Encouraged Mom to pump every 3 hours for 15 minutes to encourage milk production. Offered to assist with SNS if Mom wants baby to latch. She will advise.   Maternal Data    Feeding    LATCH Score/Interventions                      Lactation Tools Discussed/Used Tools: Pump;Nipple Shields Nipple shield size: 20;24 Breast pump type: Double-Electric Breast Pump   Consult Status Consult Status: Follow-up Date: 03/13/13 Follow-up type: In-patient    Tracy Blair, Tracy Blair Ann 03/12/2013, 5:19 PM
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
## 2013-03-13 ENCOUNTER — Encounter (HOSPITAL_COMMUNITY): Payer: Self-pay | Admitting: Obstetrics

## 2013-11-21 ENCOUNTER — Emergency Department (HOSPITAL_COMMUNITY): Payer: BC Managed Care – PPO

## 2013-11-21 ENCOUNTER — Encounter (HOSPITAL_COMMUNITY): Payer: Self-pay | Admitting: Emergency Medicine

## 2013-11-21 ENCOUNTER — Emergency Department (HOSPITAL_COMMUNITY)
Admission: EM | Admit: 2013-11-21 | Discharge: 2013-11-21 | Disposition: A | Discharge status: Home / Self Care | Payer: BC Managed Care – PPO | Attending: Emergency Medicine | Admitting: Emergency Medicine

## 2013-11-21 DIAGNOSIS — Z3202 Encounter for pregnancy test, result negative: Secondary | ICD-10-CM | POA: Insufficient documentation

## 2013-11-21 DIAGNOSIS — K529 Noninfective gastroenteritis and colitis, unspecified: Secondary | ICD-10-CM

## 2013-11-21 DIAGNOSIS — Z880 Allergy status to penicillin: Secondary | ICD-10-CM | POA: Diagnosis not present

## 2013-11-21 DIAGNOSIS — K5289 Other specified noninfective gastroenteritis and colitis: Secondary | ICD-10-CM | POA: Insufficient documentation

## 2013-11-21 DIAGNOSIS — Z79899 Other long term (current) drug therapy: Secondary | ICD-10-CM | POA: Insufficient documentation

## 2013-11-21 DIAGNOSIS — R509 Fever, unspecified: Secondary | ICD-10-CM | POA: Diagnosis not present

## 2013-11-21 DIAGNOSIS — R1084 Generalized abdominal pain: Secondary | ICD-10-CM | POA: Insufficient documentation

## 2013-11-21 DIAGNOSIS — J45909 Unspecified asthma, uncomplicated: Secondary | ICD-10-CM | POA: Diagnosis not present

## 2013-11-21 LAB — CBC WITH DIFFERENTIAL/PLATELET
BASOS ABS: 0 10*3/uL (ref 0.0–0.1)
Basophils Relative: 0 % (ref 0–1)
EOS PCT: 0 % (ref 0–5)
Eosinophils Absolute: 0 10*3/uL (ref 0.0–0.7)
HCT: 40.1 % (ref 36.0–46.0)
Hemoglobin: 13.6 g/dL (ref 12.0–15.0)
LYMPHS PCT: 9 % — AB (ref 12–46)
Lymphs Abs: 0.9 10*3/uL (ref 0.7–4.0)
MCH: 33.1 pg (ref 26.0–34.0)
MCHC: 33.9 g/dL (ref 30.0–36.0)
MCV: 97.6 fL (ref 78.0–100.0)
MONO ABS: 0.6 10*3/uL (ref 0.1–1.0)
MONOS PCT: 6 % (ref 3–12)
Neutro Abs: 8.8 10*3/uL — ABNORMAL HIGH (ref 1.7–7.7)
Neutrophils Relative %: 85 % — ABNORMAL HIGH (ref 43–77)
Platelets: 200 10*3/uL (ref 150–400)
RBC: 4.11 MIL/uL (ref 3.87–5.11)
RDW: 12 % (ref 11.5–15.5)
WBC: 10.4 10*3/uL (ref 4.0–10.5)

## 2013-11-21 LAB — URINALYSIS, ROUTINE W REFLEX MICROSCOPIC
Bilirubin Urine: NEGATIVE
Glucose, UA: NEGATIVE mg/dL
Ketones, ur: >80 mg/dL — AB
Leukocytes, UA: NEGATIVE
Nitrite: NEGATIVE
Protein, ur: NEGATIVE mg/dL
Specific Gravity, Urine: 1.03 (ref 1.005–1.030)
Urobilinogen, UA: 0.2 mg/dL (ref 0.0–1.0)
pH: 5.5 (ref 5.0–8.0)

## 2013-11-21 LAB — COMPREHENSIVE METABOLIC PANEL WITH GFR
ALT: 8 U/L (ref 0–35)
AST: 18 U/L (ref 0–37)
Albumin: 4.2 g/dL (ref 3.5–5.2)
Alkaline Phosphatase: 120 U/L — ABNORMAL HIGH (ref 39–117)
Anion gap: 15 (ref 5–15)
BUN: 8 mg/dL (ref 6–23)
CO2: 22 meq/L (ref 19–32)
Calcium: 9.3 mg/dL (ref 8.4–10.5)
Chloride: 101 meq/L (ref 96–112)
Creatinine, Ser: 0.8 mg/dL (ref 0.50–1.10)
GFR calc Af Amer: >90 mL/min
GFR calc non Af Amer: >90 mL/min
Glucose, Bld: 89 mg/dL (ref 70–99)
Potassium: 3.6 meq/L — ABNORMAL LOW (ref 3.7–5.3)
Sodium: 138 meq/L (ref 137–147)
Total Bilirubin: 1.1 mg/dL (ref 0.3–1.2)
Total Protein: 7.9 g/dL (ref 6.0–8.3)

## 2013-11-21 LAB — POC URINE PREG, ED: PREG TEST UR: NEGATIVE

## 2013-11-21 LAB — URINE MICROSCOPIC-ADD ON

## 2013-11-21 LAB — WET PREP, GENITAL
Trich, Wet Prep: NONE SEEN
Yeast Wet Prep HPF POC: NONE SEEN

## 2013-11-21 LAB — LIPASE, BLOOD: LIPASE: 23 U/L (ref 11–59)

## 2013-11-21 MED ORDER — ONDANSETRON HCL 4 MG PO TABS: 4.0000 mg | ORAL_TABLET | Freq: Four times a day (QID) | ORAL | Status: AC

## 2013-11-21 MED ORDER — ONDANSETRON HCL 4 MG/2ML IJ SOLN
4.0000 mg | Freq: Once | INTRAMUSCULAR | Status: AC
  Administered 2013-11-21: 4 mg via INTRAVENOUS
  Filled 2013-11-21: qty 2

## 2013-11-21 MED ORDER — SODIUM CHLORIDE 0.9 % IV BOLUS (SEPSIS)
1000.0000 mL | Freq: Once | INTRAVENOUS | Status: AC
  Administered 2013-11-21: 1000 mL via INTRAVENOUS

## 2013-11-21 MED ORDER — MORPHINE SULFATE 4 MG/ML IJ SOLN
4.0000 mg | Freq: Once | INTRAMUSCULAR | Status: AC
  Administered 2013-11-21: 4 mg via INTRAVENOUS
  Filled 2013-11-21: qty 1

## 2013-11-21 MED ORDER — OXYCODONE-ACETAMINOPHEN 5-325 MG PO TABS: 2.0000 | ORAL_TABLET | ORAL | Status: DC | PRN

## 2013-11-21 MED ORDER — CIPROFLOXACIN HCL 500 MG PO TABS: 500.0000 mg | ORAL_TABLET | Freq: Two times a day (BID) | ORAL | Status: DC

## 2013-11-21 MED ORDER — IOHEXOL 300 MG/ML  SOLN
100.0000 mL | Freq: Once | INTRAMUSCULAR | Status: AC | PRN
  Administered 2013-11-21: 100 mL via INTRAVENOUS

## 2013-11-21 MED ORDER — IOHEXOL 300 MG/ML  SOLN
50.0000 mL | Freq: Once | INTRAMUSCULAR | Status: AC | PRN
Start: 2013-11-21 — End: 2013-11-21
  Administered 2013-11-21: 50 mL via ORAL
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
## 2013-11-21 NOTE — ED Notes (Signed)
Pt in bathroom. Unable to draw labs.

## 2013-11-21 NOTE — ED Notes (Signed)
Per pt, states abdominal pain this am-states bright red blood in stool this am-saw PCP and was told to come here for possible appey-states WBCs elevated

## 2013-11-21 NOTE — ED Notes (Signed)
Pt states she must go to bathroom now, cannot wait for blood draw. Unable to obtain blood.

## 2013-11-21 NOTE — Discharge Instructions (Signed)
Do not breast feed with any of these prescribed medications.   Colitis Colitis is inflammation of the colon. Colitis can be a short-term or long-standing (chronic) illness. Crohn's disease and ulcerative colitis are 2 types of colitis which are chronic. They usually require lifelong treatment. CAUSES  There are many different causes of colitis, including:  Viruses.  Germs (bacteria).  Medicine reactions. SYMPTOMS   Diarrhea.  Intestinal bleeding.  Pain.  Fever.  Throwing up (vomiting).  Tiredness (fatigue).  Weight loss.  Bowel blockage. DIAGNOSIS  The diagnosis of colitis is based on examination and stool or blood tests. X-rays, CT scan, and colonoscopy may also be needed. TREATMENT  Treatment may include:  Fluids given through the vein (intravenously).  Bowel rest (nothing to eat or drink for a period of time).  Medicine for pain and diarrhea.  Medicines (antibiotics) that kill germs.  Cortisone medicines.  Surgery. HOME CARE INSTRUCTIONS   Get plenty of rest.  Drink enough water and fluids to keep your urine clear or pale yellow.  Eat a well-balanced diet.  Call your caregiver for follow-up as recommended. SEEK IMMEDIATE MEDICAL CARE IF:   You develop chills.  You have an oral temperature above 102 F (38.9 C), not controlled by medicine.  You have extreme weakness, fainting, or dehydration.  You have repeated vomiting.  You develop severe belly (abdominal) pain or are passing bloody or tarry stools. MAKE SURE YOU:   Understand these instructions.  Will watch your condition.  Will get help right away if you are not doing well or get worse. Document Released: 03/26/2004 Document Revised: 05/11/2011 Document Reviewed: 06/21/2009 [facility] Patient Information 2015 [place], [place]. This information is not intended to replace advice given to you by your health care provider. Make sure you discuss any questions you have with your health care  provider.

## 2013-11-21 NOTE — Progress Notes (Signed)
  CARE MANAGEMENT ED NOTE 11/21/2013  Patient:  Tracy Blair, Tracy Blair   Account Number:  [id number]  Date Initiated:  11/21/2013  Documentation initiated by:  Radford Pax  Subjective/Objective Assessment:   Patient presents to [facility] with generalized abdominal pain     Subjective/Objective Assessment Detail:   Patient with pmhx of asthma     Action/Plan:   Action/Plan Detail:   Anticipated DC Date:  11/21/2013     Status Recommendation to Physician:   Result of Recommendation:    Other ED Services  Consult Working Plan    DC Planning Services  Other  PCP issues    Choice offered to / List presented to:            Status of service:  Completed, signed off  ED Comments:   ED Comments Detail:  EDCM spoke to patient at bedside.  Patient confirms her pcp is Dr. Gretel Acre of [facility] updated.

## 2013-11-22 LAB — GC/CHLAMYDIA PROBE AMP
CT PROBE, AMP APTIMA: NEGATIVE
GC PROBE AMP APTIMA: NEGATIVE

## 2013-11-25 NOTE — ED Provider Notes (Signed)
Medical screening examination/treatment/procedure(s) were performed by non-physician practitioner and as supervising physician I was immediately available for consultation/collaboration.   EKG Interpretation None        Rome Echavarria M Benay Pomeroy, MD 11/25/13 2134 

## 2014-01-01 ENCOUNTER — Encounter (HOSPITAL_COMMUNITY): Payer: Self-pay | Admitting: Emergency Medicine

## 2014-01-29 ENCOUNTER — Other Ambulatory Visit: Payer: Self-pay | Admitting: Family Medicine

## 2014-01-29 ENCOUNTER — Ambulatory Visit
Admission: RE | Admit: 2014-01-29 | Discharge: 2014-01-29 | Disposition: A | Discharge status: Home / Self Care | Payer: BC Managed Care – PPO | Source: Ambulatory Visit | Attending: Family Medicine | Admitting: Family Medicine

## 2014-01-29 DIAGNOSIS — R52 Pain, unspecified: Secondary | ICD-10-CM

## 2017-04-21 ENCOUNTER — Other Ambulatory Visit: Payer: Self-pay | Admitting: Occupational Medicine

## 2017-04-21 ENCOUNTER — Ambulatory Visit: Payer: Self-pay

## 2017-04-21 DIAGNOSIS — M25562 Pain in left knee: Secondary | ICD-10-CM

## 2021-07-10 ENCOUNTER — Other Ambulatory Visit: Payer: Self-pay | Admitting: Home Modifications

## 2021-07-10 ENCOUNTER — Ambulatory Visit
Admission: RE | Admit: 2021-07-10 | Discharge: 2021-07-10 | Disposition: A | Discharge status: Home / Self Care | Payer: BC Managed Care – PPO | Source: Ambulatory Visit | Attending: Home Modifications | Admitting: Home Modifications

## 2021-07-10 DIAGNOSIS — M25461 Effusion, right knee: Secondary | ICD-10-CM

## 2021-08-29 ENCOUNTER — Ambulatory Visit
Admission: RE | Admit: 2021-08-29 | Discharge: 2021-08-29 | Disposition: A | Discharge status: Home / Self Care | Payer: BC Managed Care – PPO | Source: Ambulatory Visit | Attending: Sports Medicine | Admitting: Sports Medicine

## 2021-08-29 ENCOUNTER — Other Ambulatory Visit: Payer: Self-pay | Admitting: Sports Medicine

## 2021-08-29 DIAGNOSIS — M25562 Pain in left knee: Secondary | ICD-10-CM

## 2022-02-25 ENCOUNTER — Other Ambulatory Visit: Payer: Self-pay | Admitting: Family Medicine

## 2022-02-25 DIAGNOSIS — R140 Abdominal distension (gaseous): Secondary | ICD-10-CM

## 2022-03-10 ENCOUNTER — Other Ambulatory Visit: Payer: BC Managed Care – PPO

## 2022-03-24 ENCOUNTER — Other Ambulatory Visit: Payer: Self-pay | Admitting: Family Medicine

## 2022-03-24 DIAGNOSIS — R140 Abdominal distension (gaseous): Secondary | ICD-10-CM

## 2022-04-23 ENCOUNTER — Ambulatory Visit
Admission: RE | Admit: 2022-04-23 | Discharge: 2022-04-23 | Disposition: A | Discharge status: Home / Self Care | Payer: BC Managed Care – PPO | Source: Ambulatory Visit | Attending: Family Medicine | Admitting: Family Medicine

## 2022-04-23 DIAGNOSIS — R140 Abdominal distension (gaseous): Secondary | ICD-10-CM

## 2022-04-23 MED ORDER — IOPAMIDOL (ISOVUE-300) INJECTION 61%
100.0000 mL | Freq: Once | INTRAVENOUS | Status: AC | PRN
  Administered 2022-04-23: 100 mL via INTRAVENOUS

## 2023-02-09 DIAGNOSIS — F4323 Adjustment disorder with mixed anxiety and depressed mood: Secondary | ICD-10-CM | POA: Diagnosis not present

## 2023-02-16 DIAGNOSIS — K581 Irritable bowel syndrome with constipation: Secondary | ICD-10-CM | POA: Diagnosis not present

## 2023-02-16 DIAGNOSIS — K5900 Constipation, unspecified: Secondary | ICD-10-CM | POA: Diagnosis not present

## 2023-03-04 DIAGNOSIS — J4521 Mild intermittent asthma with (acute) exacerbation: Secondary | ICD-10-CM | POA: Diagnosis not present

## 2023-03-16 DIAGNOSIS — F4323 Adjustment disorder with mixed anxiety and depressed mood: Secondary | ICD-10-CM | POA: Diagnosis not present

## 2023-04-13 DIAGNOSIS — F4323 Adjustment disorder with mixed anxiety and depressed mood: Secondary | ICD-10-CM | POA: Diagnosis not present

## 2023-04-27 DIAGNOSIS — F4323 Adjustment disorder with mixed anxiety and depressed mood: Secondary | ICD-10-CM | POA: Diagnosis not present

## 2023-05-20 ENCOUNTER — Encounter: Payer: Self-pay | Admitting: *Deleted

## 2023-05-21 ENCOUNTER — Ambulatory Visit: Payer: BC Managed Care – PPO | Admitting: Neurology

## 2023-05-21 ENCOUNTER — Encounter: Payer: Self-pay | Admitting: Neurology

## 2023-05-21 VITALS — BP 118/75 | HR 93 | Ht 66.0 in | Wt 217.0 lb

## 2023-05-21 DIAGNOSIS — E538 Deficiency of other specified B group vitamins: Secondary | ICD-10-CM | POA: Diagnosis not present

## 2023-05-21 DIAGNOSIS — R519 Headache, unspecified: Secondary | ICD-10-CM

## 2023-05-21 DIAGNOSIS — G43709 Chronic migraine without aura, not intractable, without status migrainosus: Secondary | ICD-10-CM

## 2023-05-21 DIAGNOSIS — E559 Vitamin D deficiency, unspecified: Secondary | ICD-10-CM

## 2023-05-21 DIAGNOSIS — H539 Unspecified visual disturbance: Secondary | ICD-10-CM | POA: Diagnosis not present

## 2023-05-21 DIAGNOSIS — K5900 Constipation, unspecified: Secondary | ICD-10-CM

## 2023-05-21 DIAGNOSIS — G43009 Migraine without aura, not intractable, without status migrainosus: Secondary | ICD-10-CM | POA: Insufficient documentation

## 2023-05-21 DIAGNOSIS — R51 Headache with orthostatic component, not elsewhere classified: Secondary | ICD-10-CM | POA: Diagnosis not present

## 2023-05-21 DIAGNOSIS — R635 Abnormal weight gain: Secondary | ICD-10-CM

## 2023-05-21 DIAGNOSIS — R7309 Other abnormal glucose: Secondary | ICD-10-CM | POA: Diagnosis not present

## 2023-05-21 DIAGNOSIS — K900 Celiac disease: Secondary | ICD-10-CM

## 2023-05-21 DIAGNOSIS — R197 Diarrhea, unspecified: Secondary | ICD-10-CM

## 2023-05-21 DIAGNOSIS — K582 Mixed irritable bowel syndrome: Secondary | ICD-10-CM

## 2023-05-21 MED ORDER — QULIPTA 60 MG PO TABS: 60.0000 mg | ORAL_TABLET | Freq: Every day | ORAL | 0 refills | Status: DC

## 2023-05-21 MED ORDER — QULIPTA 60 MG PO TABS: 60.0000 mg | ORAL_TABLET | Freq: Every day | ORAL | 11 refills | Status: DC

## 2023-05-21 MED ORDER — ALPRAZOLAM 0.25 MG PO TABS: ORAL_TABLET | ORAL | 0 refills | Status: AC
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
## 2023-05-21 NOTE — Patient Instructions (Addendum)
 MRI of the brain w/wo contrast Start qulipta as prevention every day (Ajovy and emgality are excellent) Continue ubrelvy as needed Blood work Likely migraines but may consider IDIOPATHIC INTRACRANIAL HYPERTENSION and OSA ONO - overnight oxygen monitor room air x 1   Meds ordered this encounter  Medications   ALPRAZolam (XANAX) 0.25 MG tablet    Sig: Take 1-2 tabs (0.25mg -0.50mg ) 30-60 minutes before procedure. May repeat if needed.Do not drive.    Dispense:  4 tablet    Refill:  0   Atogepant (QULIPTA) 60 MG TABS    Sig: Take 1 tablet (60 mg total) by mouth daily.    Dispense:  20 tablet    Refill:  0   Atogepant (QULIPTA) 60 MG TABS    Sig: Take 1 tablet (60 mg total) by mouth daily. PLEASE RUN COPAY CARD: BIN [id number] PCN [id number] GRP [id number] ID [id number] EXP 03/01/2024    Dispense:  30 tablet    Refill:  11    PLEASE RUN COPAY CARD: BIN [id number] PCN [id number] GRP [id number] ID [id number] EXP 03/01/2024     Orders Placed This Encounter  Procedures   MR BRAIN W WO CONTRAST   Comprehensive metabolic panel   CBC with Differential/Platelets   TSH Rfx on Abnormal to Free T4   Hemoglobin A1c   Vitamin D, 25-hydroxy   B12 and Folate Panel   Celiac Disease Ab Screen w/Rfx   Idiopathic Intracranial Hypertension  Idiopathic intracranial hypertension (IIH) is a condition that increases pressure around the brain. The fluid that surrounds the brain and spinal cord (cerebrospinal fluid, or CSF) increases and causes the pressure. Idiopathic means that the cause of this condition is not known. IIH affects the brain and spinal cord. If this condition is not treated, it can cause vision loss or blindness. What are the causes? The cause of this condition is not known. What increases the risk? The following factors may make you more likely to develop this condition: Being obese. Being a person who is female, between the ages of 31 and 30 years old, and who has not gone through menopause. Taking  certain medicines, such as birth control, acne medicines, or steroids. What are the signs or symptoms? Symptoms of this condition include: Headaches. This is the most common symptom. Brief periods of total blindness. Double vision, blurred vision, or poor side (peripheral) vision. Pain in the shoulders or neck. Nausea and vomiting. A sound like rushing water or a pulsing sound within the ears (pulsatile tinnitus), or ringing in the ears. How is this diagnosed? This condition may be diagnosed based on: Your symptoms and medical history. Imaging tests of the brain, such as: CT scan. MRI. Magnetic resonance venogram (MRV) to check the veins. Diagnostic lumbar puncture. This is a procedure to remove and examine a sample of CSF. This procedure can determine whether your fluid pressure is too high. An eye exam to check for swelling or nerve damage in the eyes. How is this treated? Treatment for this condition depends on the symptoms. The goal of treatment is to decrease the pressure around your brain. Common treatments include: Weight loss through healthy eating, salt restriction, and exercise, if you are overweight. Medicines to decrease the production of CSF and lower the pressure within your skull. Medicines to prevent or treat headaches. Other treatments may include: Surgery to place drains (shunts) in your brain to remove extra fluid. Lumbar puncture to remove extra CSF. Follow these instructions at home: If you are overweight or  obese, work with your health care provider to lose weight. Take over-the-counter and prescription medicines only as told by your health care provider. Ask your health care provider if the medicine prescribed to you requires you to avoid driving or using machinery. Do not use any products that contain nicotine or tobacco. These products include cigarettes, chewing tobacco, and vaping devices, such as e-cigarettes. If you need help quitting, ask your health care  provider. Keep all follow-up visits. Your health care provider will need to monitor you regularly. Contact a health care provider if: You have changes in your vision, such as: Double vision. Blurred vision. Poor peripheral vision. Get help right away if: You have any of the following symptoms and they get worse or do not get better: Headaches. Nausea. Vomiting. Sudden trouble seeing. This information is not intended to replace advice given to you by your health care provider. Make sure you discuss any questions you have with your health care provider. Document Revised: 07/15/2021 Document Reviewed: 06/24/2021 Elsevier Patient Education  2024 Elsevier Inc.  Migraine Headache A migraine headache is an intense pulsing or throbbing pain on one or both sides of the head. Migraine headaches may also cause other symptoms, such as nausea, vomiting, and sensitivity to light and noise. A migraine headache can last from 4 hours to 3 days. Talk with your health care provider about what things may bring on (trigger) your migraine headaches. What are the causes? The exact cause is not known. However, a migraine may be caused when nerves in the brain get irritated and release chemicals that cause blood vessels to become inflamed. This inflammation causes pain. Migraines may be triggered or caused by: Smoking. Medicines, such as: Nitroglycerin, which is used to treat chest pain. Birth control pills. Estrogen. Certain blood pressure medicines. Foods or drinks that contain nitrates, glutamate, aspartame, MSG, or tyramine. Certain foods or drinks, such as aged cheeses, chocolate, alcohol, or caffeine. Doing physical activity that is very hard. Other triggers may include: Menstruation. Pregnancy. Hunger. Stress. Getting too much or too little sleep. Weather changes. Tiredness (fatigue). What increases the risk? The following factors may make you more likely to have migraine headaches: Being  between the ages of 3-49 years old. Being female. Having a family history of migraine headaches. Being Caucasian. Having a mental health condition, such as depression or anxiety. Being obese. What are the signs or symptoms? The main symptom of this condition is pulsing or throbbing pain. This pain may: Happen in any area of the head, such as on one or both sides. Make it hard to do daily activities. Get worse with physical activity. Get worse around bright lights, loud noises, or smells. Other symptoms may include: Nausea. Vomiting. Dizziness. Before a migraine headache starts, you may get warning signs (an aura). An aura may include: Seeing flashing lights or having blind spots. Seeing bright spots, halos, or zigzag lines. Having tunnel vision or blurred vision. Having numbness or a tingling feeling. Having trouble talking. Having muscle weakness. After a migraine ends, you may have symptoms. These may include: Feeling tired. Trouble concentrating. How is this diagnosed? A migraine headache can be diagnosed based on: Your symptoms. A physical exam. Tests, such as: A CT scan or an MRI of the head. These tests can help rule out other causes of headaches. Taking fluid from the spine (lumbar puncture) to examine it (cerebrospinal fluid analysis, or CSF analysis). How is this treated? This condition may be treated with medicines that: Relieve pain and  nausea. Prevent migraines. Treatment may also include: Acupuncture. Lifestyle changes like avoiding foods that trigger migraine headaches. Learning ways to control your body (biofeedback). Talk therapy to help you know and deal with negative thoughts (cognitive behavioral therapy). Follow these instructions at home: Medicines Take over-the-counter and prescription medicines only as told by your provider. Ask your provider if the medicine prescribed to you: Requires you to avoid driving or using machinery. Can cause  constipation. You may need to take these actions to prevent or treat constipation: Drink enough fluid to keep your pee (urine) pale yellow. Take over-the-counter or prescription medicines. Eat foods that are high in fiber, such as beans, whole grains, and fresh fruits and vegetables. Limit foods that are high in fat and processed sugars, such as fried or sweet foods. Lifestyle  Do not drink alcohol. Do not use any products that contain nicotine or tobacco. These products include cigarettes, chewing tobacco, and vaping devices, such as e-cigarettes. If you need help quitting, ask your provider. Get 7-9 hours of sleep each night, or the amount recommended by your provider. Find ways to manage stress, such as meditation, deep breathing, or yoga. Try to exercise regularly. This can help lessen how bad and how often your migraines occur. General instructions Keep a journal to find out what triggers your migraines, so you can avoid those things. For example, write down: What you eat and drink. How much sleep you get. Any change to your diet or medicines. If you have a migraine headache: Avoid things that make your symptoms worse, such as bright lights. Lie down in a dark, quiet room. Do not drive or use machinery. Ask your provider what activities are safe for you while you have symptoms. Keep all follow-up visits. Your provider will monitor your symptoms and recommend any further treatment. Where to find more information Coalition for Headache and Migraine Patients (CHAMP): headachemigraine.org American Migraine Foundation: americanmigrainefoundation.org National Headache Foundation: headaches.org Contact a health care provider if: You have symptoms that are different or worse than your usual migraine headache symptoms. You have more than 15 days of headaches in one month. Get help right away if: Your migraine headache becomes severe or lasts more than 72 hours. You have a fever or stiff  neck. You have vision loss. Your muscles feel weak or like you cannot control them. You lose your balance often or have trouble walking. You faint. You have a seizure. This information is not intended to replace advice given to you by your health care provider. Make sure you discuss any questions you have with your health care provider. Document Revised: 10/13/2021 Document Reviewed: 10/13/2021 Elsevier Patient Education  2024 Elsevier Inc.Sleep Apnea  Sleep apnea is a condition that affects your breathing while you are sleeping. Your tongue or soft tissue in your throat may block the flow of air while you sleep. You may have shallow breathing or stop breathing for short periods of time. People with sleep apnea may snore loudly. There are three kinds of sleep apnea: Obstructive sleep apnea. This kind is caused by a blocked or collapsed airway. This is the most common. Central sleep apnea. This kind happens when the part of the brain that controls breathing does not send the correct signals to the muscles that control breathing. Mixed sleep apnea. This is a combination of obstructive and central sleep apnea. What are the causes? The most common cause of sleep apnea is a collapsed or blocked airway. What increases the risk? Being very overweight. Having  family members with sleep apnea. Having a tongue or tonsils that are larger than normal. Having a small airway or jaw problems. Being older. What are the signs or symptoms? Loud snoring. Restless sleep. Trouble staying asleep. Being sleepy or tired during the day. Waking up gasping or choking. Having a headache in the morning. Mood swings. Having a hard time remembering things and concentrating. How is this diagnosed? A medical history. A physical exam. A sleep study. This is also called a polysomnography test. This test is done at a sleep lab or in your home while you are sleeping. How is this treated? Treatment may  include: Sleeping on your side. Losing weight if you're overweight. Wearing an oral appliance. This is a mouthpiece that moves your lower jaw forward. Using a positive airway pressure (PAP) device to keep your airways open while you sleep, such as: A continuous positive airway pressure (CPAP) device. This device gives forced air through a mask when you breathe out. This keeps your airways open. A bilevel positive airway pressure (BIPAP) device. This device gives forced air through a mask when you breathe in and when you breathe out to keep your airways open. Having surgery if other treatments do not work. If your sleep apnea is not treated, you may be at risk for: Heart failure. Heart attack. Stroke. Type 2 diabetes or a problem with your blood sugar called insulin resistance. Follow these instructions at home: Medicines Take your medicines only as told by your health care provider. Avoid alcohol, medicines to help you relax, and certain pain medicines. These may make sleep apnea worse. General instructions Do not smoke, vape, or use products with nicotine or tobacco in them. If you need help quitting, talk with your provider. If you were given a PAP device to open your airway while you sleep, use it as told by your provider. If you're having surgery, make sure to tell your provider you have sleep apnea. You may need to bring your PAP device with you. Contact a health care provider if: The PAP device that you were given to use during sleep bothers you or does not seem to be working. You do not feel better or you feel worse. Get help right away if: You have trouble breathing. You have chest pain. You have trouble talking. One side of your body feels weak. A part of your face is hanging down. These symptoms may be an emergency. Call 911 right away. Do not wait to see if the symptoms will go away. Do not drive yourself to the hospital. This information is not intended to replace advice  given to you by your health care provider. Make sure you discuss any questions you have with your health care provider. Document Revised: 11/19/2022 Document Reviewed: 04/23/2022 Elsevier Patient Education  2024 [employer].

## 2023-05-22 LAB — COMPREHENSIVE METABOLIC PANEL
ALT: 10 IU/L (ref 0–32)
AST: 18 IU/L (ref 0–40)
Albumin: 4.5 g/dL (ref 3.9–4.9)
Alkaline Phosphatase: 78 IU/L (ref 44–121)
BUN/Creatinine Ratio: 11 (ref 9–23)
BUN: 10 mg/dL (ref 6–20)
Bilirubin Total: 0.6 mg/dL (ref 0.0–1.2)
CO2: 23 mmol/L (ref 20–29)
Calcium: 9.7 mg/dL (ref 8.7–10.2)
Chloride: 102 mmol/L (ref 96–106)
Creatinine, Ser: 0.91 mg/dL (ref 0.57–1.00)
Globulin, Total: 2.6 g/dL (ref 1.5–4.5)
Glucose: 95 mg/dL (ref 70–99)
Potassium: 4.4 mmol/L (ref 3.5–5.2)
Sodium: 139 mmol/L (ref 134–144)
Total Protein: 7.1 g/dL (ref 6.0–8.5)
eGFR: 83 mL/min/{1.73_m2} (ref >59)

## 2023-05-22 LAB — CBC WITH DIFFERENTIAL/PLATELET
Basophils Absolute: 0 10*3/uL (ref 0.0–0.2)
Basos: 0 %
EOS (ABSOLUTE): 0.1 10*3/uL (ref 0.0–0.4)
Eos: 1 %
Hematocrit: 41 % (ref 34.0–46.6)
Hemoglobin: 13.8 g/dL (ref 11.1–15.9)
Immature Grans (Abs): 0 10*3/uL (ref 0.0–0.1)
Immature Granulocytes: 0 %
Lymphocytes Absolute: 1.7 10*3/uL (ref 0.7–3.1)
Lymphs: 19 %
MCH: 33.6 pg — ABNORMAL HIGH (ref 26.6–33.0)
MCHC: 33.7 g/dL (ref 31.5–35.7)
MCV: 100 fL — ABNORMAL HIGH (ref 79–97)
Monocytes Absolute: 0.5 10*3/uL (ref 0.1–0.9)
Monocytes: 6 %
Neutrophils Absolute: 6.3 10*3/uL (ref 1.4–7.0)
Neutrophils: 74 %
Platelets: 250 10*3/uL (ref 150–450)
RBC: 4.11 x10E6/uL (ref 3.77–5.28)
RDW: 11.8 % (ref 11.7–15.4)
WBC: 8.6 10*3/uL (ref 3.4–10.8)

## 2023-05-22 LAB — VITAMIN D 25 HYDROXY (VIT D DEFICIENCY, FRACTURES): Vit D, 25-Hydroxy: 36.9 ng/mL (ref 30.0–100.0)

## 2023-05-22 LAB — HEMOGLOBIN A1C
Est. average glucose Bld gHb Est-mCnc: 97 mg/dL
Hgb A1c MFr Bld: 5 % (ref 4.8–5.6)

## 2023-05-22 LAB — B12 AND FOLATE PANEL
Folate: >20 ng/mL (ref >3.0)
Vitamin B-12: 530 pg/mL (ref 232–1245)

## 2023-05-22 LAB — TSH RFX ON ABNORMAL TO FREE T4: TSH: 0.968 u[IU]/mL (ref 0.450–4.500)

## 2023-05-23 ENCOUNTER — Encounter: Payer: Self-pay | Admitting: Neurology

## 2023-05-24 ENCOUNTER — Other Ambulatory Visit: Payer: Self-pay | Admitting: *Deleted

## 2023-05-24 DIAGNOSIS — G43709 Chronic migraine without aura, not intractable, without status migrainosus: Secondary | ICD-10-CM

## 2023-05-24 DIAGNOSIS — H539 Unspecified visual disturbance: Secondary | ICD-10-CM

## 2023-05-24 DIAGNOSIS — R519 Headache, unspecified: Secondary | ICD-10-CM

## 2023-05-24 DIAGNOSIS — R51 Headache with orthostatic component, not elsewhere classified: Secondary | ICD-10-CM

## 2023-05-24 DIAGNOSIS — R635 Abnormal weight gain: Secondary | ICD-10-CM

## 2023-05-24 LAB — CELIAC DISEASE AB SCREEN W/RFX
Antigliadin Abs, IgA: 3 U (ref 0–19)
IgA/Immunoglobulin A, Serum: 60 mg/dL — ABNORMAL LOW (ref 87–352)

## 2023-05-24 NOTE — Progress Notes (Signed)
 Sharyne Richters, RN; Kathyrn Sheriff; Carlisle Beers; Macon Large; 1 other Received, Thank you     Previous Messages    ----- Message ----- From: Guy Begin, RN Sent: 05/24/2023  12:13 PM EDT To: Kathyrn Sheriff; Santina Evans; Kathe Becton; * Subject: ONO on room air                                Michael Litter Female, 38 y.o., 07-20-1985 MRN: [id number]     ONO on room air  Thank you  Andrey Campanile

## 2023-05-25 DIAGNOSIS — F4323 Adjustment disorder with mixed anxiety and depressed mood: Secondary | ICD-10-CM | POA: Diagnosis not present

## 2023-06-02 ENCOUNTER — Ambulatory Visit

## 2023-06-02 ENCOUNTER — Encounter: Payer: Self-pay | Admitting: Neurology

## 2023-06-02 DIAGNOSIS — R635 Abnormal weight gain: Secondary | ICD-10-CM

## 2023-06-02 DIAGNOSIS — G43709 Chronic migraine without aura, not intractable, without status migrainosus: Secondary | ICD-10-CM

## 2023-06-02 MED ORDER — GADOBENATE DIMEGLUMINE 529 MG/ML IV SOLN
20.0000 mL | Freq: Once | INTRAVENOUS | Status: AC | PRN
  Administered 2023-06-02: 20 mL via INTRAVENOUS

## 2023-06-07 ENCOUNTER — Telehealth: Payer: Self-pay | Admitting: *Deleted

## 2023-06-07 NOTE — Telephone Encounter (Signed)
Qulipta PA needed  

## 2023-06-08 DIAGNOSIS — F4323 Adjustment disorder with mixed anxiety and depressed mood: Secondary | ICD-10-CM | POA: Diagnosis not present

## 2023-06-09 ENCOUNTER — Other Ambulatory Visit (HOSPITAL_COMMUNITY): Payer: Self-pay

## 2023-06-09 ENCOUNTER — Telehealth: Payer: Self-pay

## 2023-06-09 NOTE — Telephone Encounter (Signed)
 Pharmacy Patient Advocate Encounter   Received notification from Physician's Office that prior authorization for Qulipta 60MG  tablets is required/requested.   Insurance verification completed.   The patient is insured through [facility] .   Per test claim: PA required; PA submitted to above mentioned insurance via CoverMyMeds Key/confirmation #/EOC [id number] Status is pending

## 2023-06-09 NOTE — Telephone Encounter (Signed)
   For future reference of how PT name is to be put in [facility].

## 2023-06-09 NOTE — Telephone Encounter (Signed)
 Tracy Blair from [place] [facility] [facility] has called to report that based on the information provided to them for the PA for  Atogepant (QULIPTA) 60 MG TABS it has been denied, if there are questions the provider line is [phone] option 3 then option 1

## 2023-06-10 ENCOUNTER — Other Ambulatory Visit (HOSPITAL_COMMUNITY): Payer: Self-pay

## 2023-06-10 ENCOUNTER — Telehealth: Payer: Self-pay

## 2023-06-10 ENCOUNTER — Other Ambulatory Visit: Payer: Self-pay | Admitting: Neurology

## 2023-06-10 ENCOUNTER — Encounter: Payer: Self-pay | Admitting: *Deleted

## 2023-06-10 DIAGNOSIS — G43709 Chronic migraine without aura, not intractable, without status migrainosus: Secondary | ICD-10-CM

## 2023-06-10 MED ORDER — AJOVY 225 MG/1.5ML ~~LOC~~ SOAJ: 225.0000 mg | SUBCUTANEOUS | 11 refills | Status: AC

## 2023-06-10 NOTE — Telephone Encounter (Addendum)
 Called insurance-they stated they faxed denial letter to [facility] office at [phone], even though per cmm I always out [phone] so I can have access to the information. PA was denied because PT must have tried and failed Ajovy and Emgality or provide clinical reasoning as to why the PT can not try those medications. I have asked for the denial letter to be faxed to [employer] faxed will upload in PT chart.  Call reference: KatriniaH 06/10/2023 10:35AM

## 2023-06-10 NOTE — Telephone Encounter (Signed)
 I ordered Ajovy, thank you

## 2023-06-10 NOTE — Telephone Encounter (Signed)
 Pharmacy Patient Advocate Encounter   Received notification from Physician's Office that prior authorization for AJOVY (fremanezumab-vfrm) injection 225MG /1.5ML auto-injectors is required/requested.   Insurance verification completed.   The patient is insured through [facility] .   Per test claim: PA required; PA submitted to above mentioned insurance via CoverMyMeds Key/confirmation #/EOC [id number] Status is pending

## 2023-06-10 NOTE — Telephone Encounter (Signed)
   Denial letter placed in chart under media tab.

## 2023-06-10 NOTE — Telephone Encounter (Signed)
 I have no details on this-nothing has been sent to me or my fax. Did the person who took the call request the denial letter to be faxed over?    There was no denial letter attached in CMM.  I will outreach insurance to try to verify why and have denial letter faxed over.

## 2023-06-11 ENCOUNTER — Other Ambulatory Visit (HOSPITAL_COMMUNITY): Payer: Self-pay

## 2023-06-11 NOTE — Telephone Encounter (Signed)
 Pharmacy Patient Advocate Encounter  Received notification from [facility] that Prior Authorization for AJOVY (fremanezumab-vfrm) injection 225MG /1.5ML auto-injectors has been APPROVED from 06/10/2023 to 09/02/2023. Ran test claim, Copay is $24.98. This test claim was processed through [facility] copay amounts may vary at other pharmacies due to pharmacy/plan contracts, or as the patient moves through the different stages of their insurance plan.   PA #/Case ID/Reference #: PA Case ID #: [id number]

## 2023-07-05 MED ORDER — UBRELVY 50 MG PO TABS
ORAL_TABLET | ORAL | 11 refills | Status: DC
Start: 2023-07-05 — End: 2023-09-22

## 2023-07-20 DIAGNOSIS — F4323 Adjustment disorder with mixed anxiety and depressed mood: Secondary | ICD-10-CM | POA: Diagnosis not present

## 2023-07-21 DIAGNOSIS — R140 Abdominal distension (gaseous): Secondary | ICD-10-CM | POA: Diagnosis not present

## 2023-07-21 DIAGNOSIS — R110 Nausea: Secondary | ICD-10-CM | POA: Diagnosis not present

## 2023-07-21 DIAGNOSIS — K589 Irritable bowel syndrome without diarrhea: Secondary | ICD-10-CM | POA: Diagnosis not present

## 2023-07-27 DIAGNOSIS — E669 Obesity, unspecified: Secondary | ICD-10-CM | POA: Diagnosis not present

## 2023-07-27 DIAGNOSIS — Z3041 Encounter for surveillance of contraceptive pills: Secondary | ICD-10-CM | POA: Diagnosis not present

## 2023-08-04 DIAGNOSIS — G4730 Sleep apnea, unspecified: Secondary | ICD-10-CM | POA: Diagnosis not present

## 2023-08-04 DIAGNOSIS — R0902 Hypoxemia: Secondary | ICD-10-CM | POA: Diagnosis not present

## 2023-08-09 DIAGNOSIS — F4323 Adjustment disorder with mixed anxiety and depressed mood: Secondary | ICD-10-CM | POA: Diagnosis not present

## 2023-08-11 DIAGNOSIS — E669 Obesity, unspecified: Secondary | ICD-10-CM | POA: Diagnosis not present

## 2023-08-11 DIAGNOSIS — Z6836 Body mass index (BMI) 36.0-36.9, adult: Secondary | ICD-10-CM | POA: Diagnosis not present

## 2023-09-06 DIAGNOSIS — F4323 Adjustment disorder with mixed anxiety and depressed mood: Secondary | ICD-10-CM | POA: Diagnosis not present

## 2023-09-14 DIAGNOSIS — F4323 Adjustment disorder with mixed anxiety and depressed mood: Secondary | ICD-10-CM | POA: Diagnosis not present

## 2023-09-22 ENCOUNTER — Telehealth: Payer: Self-pay | Admitting: Neurology

## 2023-09-22 ENCOUNTER — Telehealth: Payer: Self-pay | Admitting: *Deleted

## 2023-09-22 ENCOUNTER — Telehealth: Admitting: Neurology

## 2023-09-22 DIAGNOSIS — G43009 Migraine without aura, not intractable, without status migrainosus: Secondary | ICD-10-CM | POA: Diagnosis not present

## 2023-09-22 MED ORDER — QULIPTA 60 MG PO TABS: 60.0000 mg | ORAL_TABLET | Freq: Every day | ORAL | 11 refills | Status: AC

## 2023-09-22 MED ORDER — UBRELVY 100 MG PO TABS: 100.0000 mg | ORAL_TABLET | ORAL | 11 refills | Status: AC | PRN

## 2023-09-22 NOTE — Telephone Encounter (Signed)
 Spoke with Maddie @ [facility]. She will fax the ONO results to our office.

## 2023-09-22 NOTE — Telephone Encounter (Signed)
-----   Message from Onetha KATHEE Epp sent at 09/22/2023  9:23 AM EDT ----- Regarding: Overnight Ox Monitor Patient states she completed the overnight Ox Monitor, I looked through all my notes and do not see results. Would you call and get a copy please? Thank you !
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
## 2023-09-22 NOTE — Telephone Encounter (Signed)
 Called patient and scheduled VV with Amy for 04/18/24 at 9:15am

## 2023-09-22 NOTE — Telephone Encounter (Signed)
 6 month follow up with NP video med check

## 2023-09-28 DIAGNOSIS — F4323 Adjustment disorder with mixed anxiety and depressed mood: Secondary | ICD-10-CM | POA: Diagnosis not present

## 2023-09-29 ENCOUNTER — Other Ambulatory Visit (HOSPITAL_COMMUNITY): Payer: Self-pay

## 2023-09-29 ENCOUNTER — Telehealth: Payer: Self-pay | Admitting: Pharmacy Technician

## 2023-09-29 NOTE — Telephone Encounter (Signed)
 Pharmacy Patient Advocate Encounter   Received notification from Fax that prior authorization for Ubrelvy  100MG  tablets is required/requested.   Insurance verification completed.   The patient is insured through [facility] .   Per test claim: PA required; PA submitted to above mentioned insurance via CoverMyMeds Key/confirmation #/EOC [id number] Status is pending

## 2023-09-30 ENCOUNTER — Other Ambulatory Visit (HOSPITAL_COMMUNITY): Payer: Self-pay

## 2023-09-30 NOTE — Telephone Encounter (Signed)
 Pharmacy Patient Advocate Encounter   Received notification from Patient Pharmacy that prior authorization for Ubrelvy  is required/requested.   Insurance verification completed.   The patient is insured through [facility] .   Per test claim: PA required; PA submitted to above mentioned insurance via Fax Key/confirmation #/EOC [id number] Status is pending  Received a faxed request to complete Initial [place] Form-faxed form to [phone]. Awaiting determination.

## 2023-10-01 NOTE — Telephone Encounter (Signed)
 Pharmacy Patient Advocate Encounter  Received notification from [facility] that Prior Authorization for Ubrelvy  100MG  tablets has been APPROVED from 09/29/2023 to 12/22/2023   PA #/Case ID/Reference #: PA Case ID #: [id number]

## 2023-10-12 DIAGNOSIS — F4323 Adjustment disorder with mixed anxiety and depressed mood: Secondary | ICD-10-CM | POA: Diagnosis not present

## 2023-10-15 DIAGNOSIS — J4521 Mild intermittent asthma with (acute) exacerbation: Secondary | ICD-10-CM | POA: Diagnosis not present

## 2023-10-15 DIAGNOSIS — Z0000 Encounter for general adult medical examination without abnormal findings: Secondary | ICD-10-CM | POA: Diagnosis not present

## 2023-10-15 DIAGNOSIS — G43909 Migraine, unspecified, not intractable, without status migrainosus: Secondary | ICD-10-CM | POA: Diagnosis not present

## 2023-10-15 DIAGNOSIS — F419 Anxiety disorder, unspecified: Secondary | ICD-10-CM | POA: Diagnosis not present

## 2023-10-26 DIAGNOSIS — F4323 Adjustment disorder with mixed anxiety and depressed mood: Secondary | ICD-10-CM | POA: Diagnosis not present

## 2023-11-05 DIAGNOSIS — K589 Irritable bowel syndrome without diarrhea: Secondary | ICD-10-CM | POA: Diagnosis not present

## 2023-11-05 DIAGNOSIS — Z0000 Encounter for general adult medical examination without abnormal findings: Secondary | ICD-10-CM | POA: Diagnosis not present

## 2023-11-09 DIAGNOSIS — F4323 Adjustment disorder with mixed anxiety and depressed mood: Secondary | ICD-10-CM | POA: Diagnosis not present

## 2023-11-19 DIAGNOSIS — Z6834 Body mass index (BMI) 34.0-34.9, adult: Secondary | ICD-10-CM | POA: Diagnosis not present

## 2023-11-19 DIAGNOSIS — E669 Obesity, unspecified: Secondary | ICD-10-CM | POA: Diagnosis not present

## 2023-11-29 DIAGNOSIS — F4323 Adjustment disorder with mixed anxiety and depressed mood: Secondary | ICD-10-CM | POA: Diagnosis not present

## 2023-12-14 DIAGNOSIS — F4323 Adjustment disorder with mixed anxiety and depressed mood: Secondary | ICD-10-CM | POA: Diagnosis not present

## 2023-12-21 DIAGNOSIS — F4323 Adjustment disorder with mixed anxiety and depressed mood: Secondary | ICD-10-CM | POA: Diagnosis not present

## 2023-12-24 ENCOUNTER — Telehealth: Payer: Self-pay | Admitting: Pharmacist

## 2023-12-24 NOTE — Telephone Encounter (Signed)
 Pharmacy Patient Advocate Encounter   Received notification from CoverMyMeds that prior authorization for Ubrelvy  100MG  tablets is required/requested.   Insurance verification completed.   The patient is insured through [facility].   Per test claim: PA required; PA submitted to above mentioned insurance via Latent Key/confirmation #/EOC [employer] Status is pending

## 2023-12-24 NOTE — Telephone Encounter (Signed)
 Pharmacy Patient Advocate Encounter  Received notification from [facility] that Prior Authorization for UBRELVY  100 MG PO TABS has been APPROVED from 12/24/2023 to 12/23/2024   PA #/Case ID/Reference #: [id number]

## 2023-12-28 IMAGING — CR DG KNEE 1-2V*R*
2 series · 2 of 2 positions shown · non-contrast
Comparison: None Available.

CLINICAL DATA: 35-year-old female with right knee effusion, loss of
range of motion x 1 month.

EXAM:
RIGHT KNEE - 1-2 VIEW

[w knee ap right]
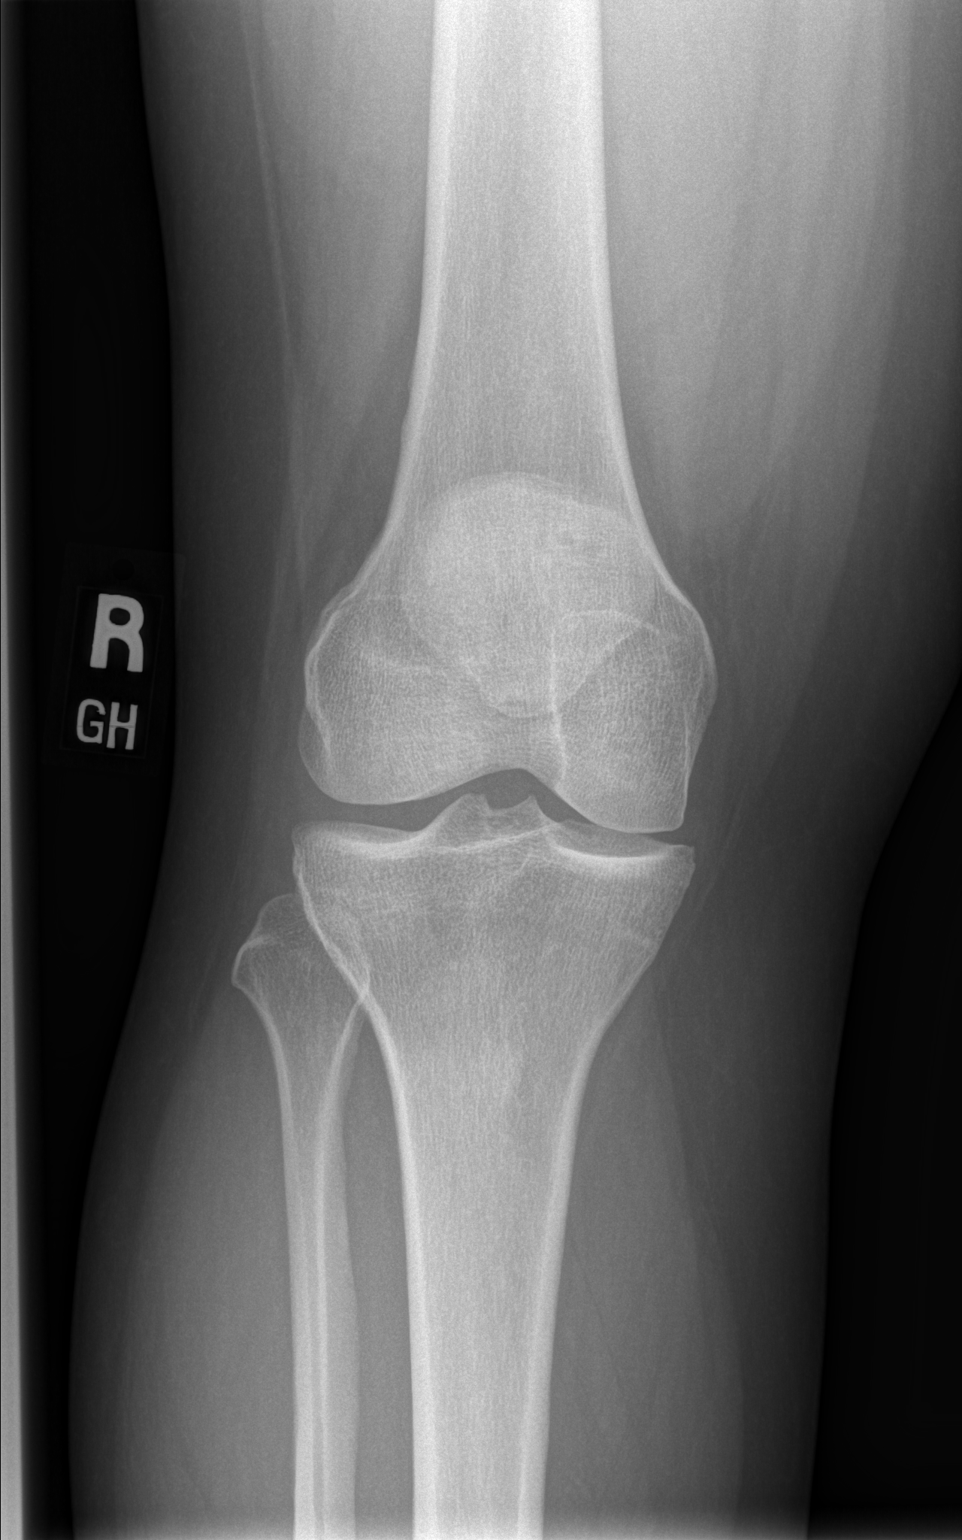

[w knee lat. right]
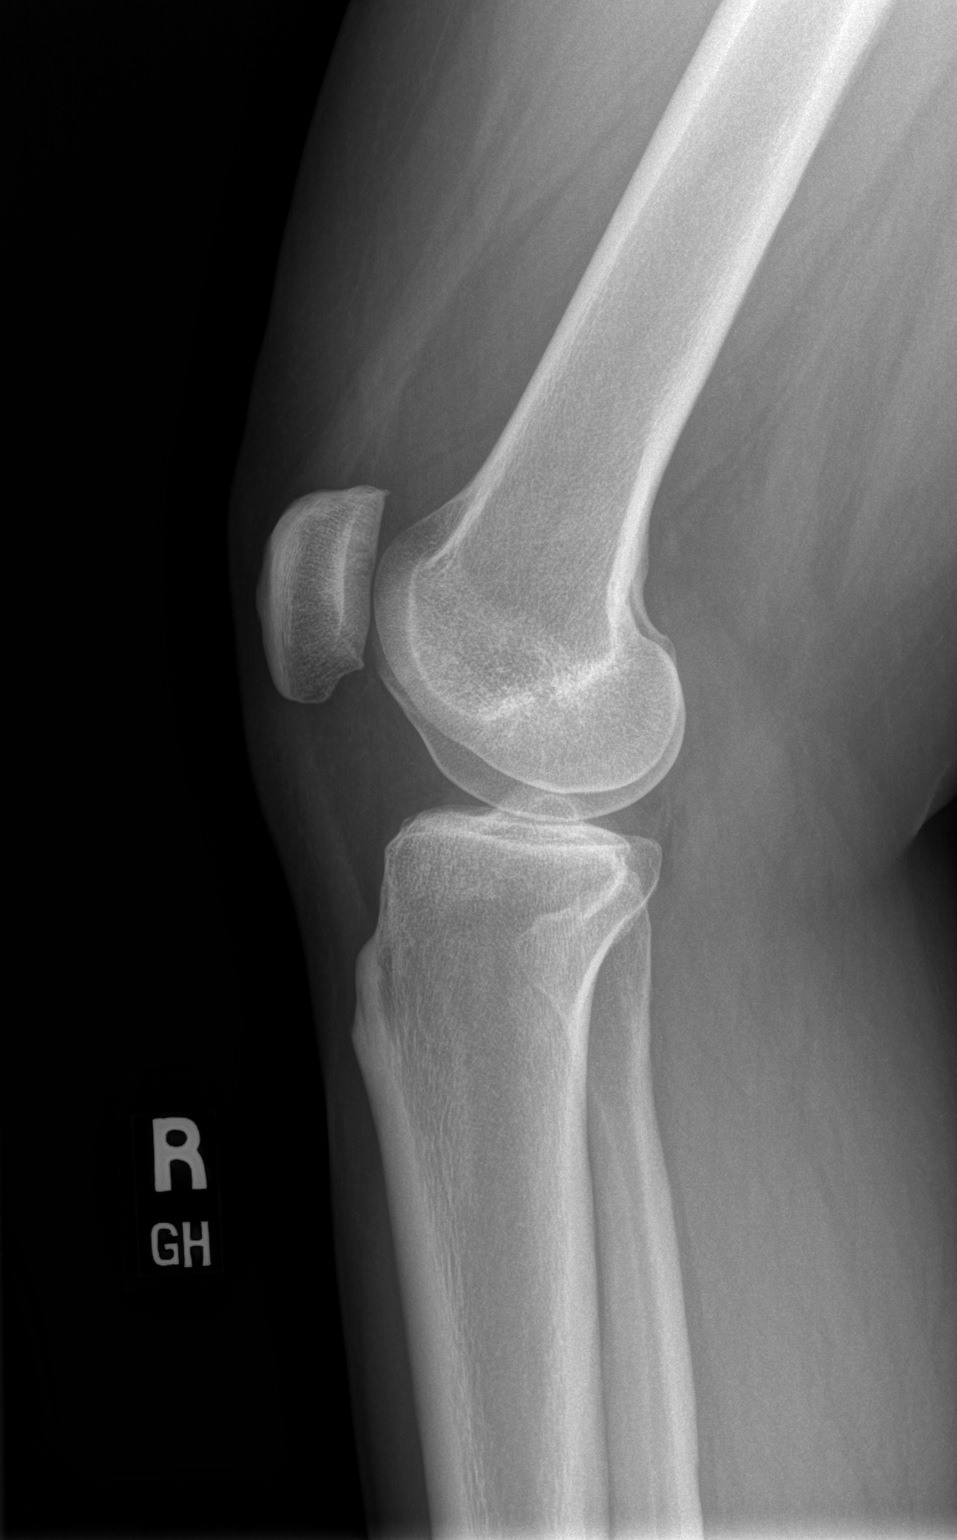

[2 of 2 positions shown; findings below may reference images not displayed]

FINDINGS: Bone mineralization is within normal limits. No evidence of joint
effusion on the lateral view. Mild patellofemoral and medial
compartment degenerative spurring appears advanced for age, but
joint spaces are relatively preserved. No acute osseous abnormality
identified. Visible soft tissue contours appear normal.
IMPRESSION: Evidence of mild but age advanced patellofemoral and medial
compartment degeneration. No joint effusion or acute osseous
abnormality identified.

## 2023-12-31 DIAGNOSIS — R110 Nausea: Secondary | ICD-10-CM | POA: Diagnosis not present

## 2023-12-31 DIAGNOSIS — R140 Abdominal distension (gaseous): Secondary | ICD-10-CM | POA: Diagnosis not present

## 2024-01-04 DIAGNOSIS — F4323 Adjustment disorder with mixed anxiety and depressed mood: Secondary | ICD-10-CM | POA: Diagnosis not present

## 2024-01-18 DIAGNOSIS — F4323 Adjustment disorder with mixed anxiety and depressed mood: Secondary | ICD-10-CM | POA: Diagnosis not present

## 2024-01-19 DIAGNOSIS — F4323 Adjustment disorder with mixed anxiety and depressed mood: Secondary | ICD-10-CM | POA: Diagnosis not present

## 2024-02-01 DIAGNOSIS — F4323 Adjustment disorder with mixed anxiety and depressed mood: Secondary | ICD-10-CM | POA: Diagnosis not present

## 2024-04-18 ENCOUNTER — Telehealth: Admitting: Family Medicine
